# Patient Record
Sex: Male | Born: 1945 | Race: White | Hispanic: Yes | Marital: Married | Smoking: Never smoker
Health system: Southern US, Community
[De-identification: ages and names within clinical notes are randomized; demographics above are authoritative.]

## PROBLEM LIST (undated history)

## (undated) DIAGNOSIS — J302 Other seasonal allergic rhinitis: Secondary | ICD-10-CM

## (undated) DIAGNOSIS — S022XXA Fracture of nasal bones, initial encounter for closed fracture: Secondary | ICD-10-CM

## (undated) DIAGNOSIS — N400 Enlarged prostate without lower urinary tract symptoms: Secondary | ICD-10-CM

## (undated) HISTORY — DX: Other seasonal allergic rhinitis: J30.2

## (undated) HISTORY — DX: Benign prostatic hyperplasia without lower urinary tract symptoms: N40.0

---
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
## 2001-12-22 ENCOUNTER — Encounter: Payer: Self-pay | Admitting: General Surgery

## 2001-12-22 ENCOUNTER — Ambulatory Visit (HOSPITAL_COMMUNITY): Admission: RE | Admit: 2001-12-22 | Discharge: 2001-12-22 | Payer: Self-pay | Admitting: General Surgery

## 2002-05-25 ENCOUNTER — Emergency Department (HOSPITAL_COMMUNITY): Admission: EM | Admit: 2002-05-25 | Discharge: 2002-05-26 | Payer: Self-pay | Admitting: Emergency Medicine

## 2009-07-20 DIAGNOSIS — S022XXA Fracture of nasal bones, initial encounter for closed fracture: Secondary | ICD-10-CM

## 2009-07-20 HISTORY — DX: Fracture of nasal bones, initial encounter for closed fracture: S02.2XXA

## 2010-03-21 ENCOUNTER — Emergency Department (HOSPITAL_BASED_OUTPATIENT_CLINIC_OR_DEPARTMENT_OTHER): Admission: EM | Admit: 2010-03-21 | Discharge: 2010-03-21 | Payer: Self-pay | Admitting: Emergency Medicine

## 2010-03-21 ENCOUNTER — Ambulatory Visit: Payer: Self-pay | Admitting: Diagnostic Radiology

## 2010-05-08 ENCOUNTER — Ambulatory Visit (HOSPITAL_COMMUNITY): Admission: RE | Admit: 2010-05-08 | Discharge: 2010-05-08 | Payer: Self-pay | Admitting: General Surgery

## 2011-08-05 IMAGING — CR DG FACIAL BONES COMPLETE 3+V
5 series · 5 of 5 positions shown · non-contrast
Comparison: None.

CLINICAL DATA: Nasal laceration.  Fall.

FACIAL BONES COMPLETE 3+V

[w waters *]
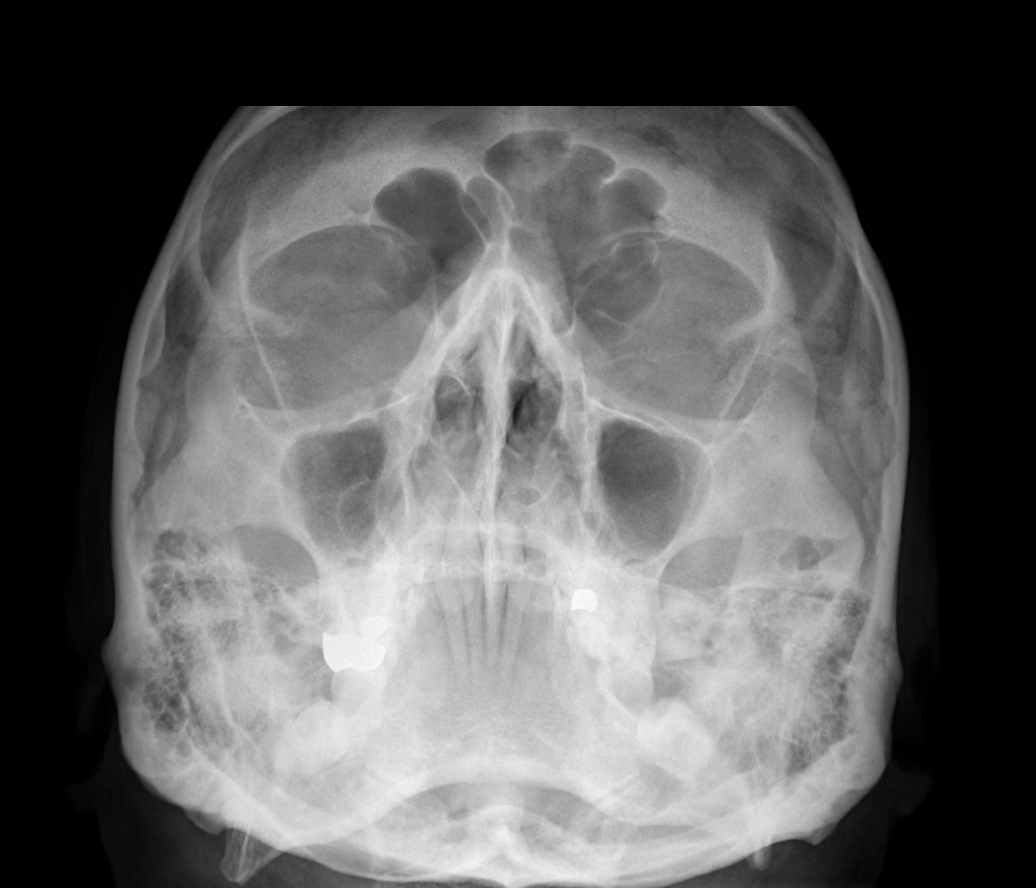

[[person_name] * (1 of 2)]
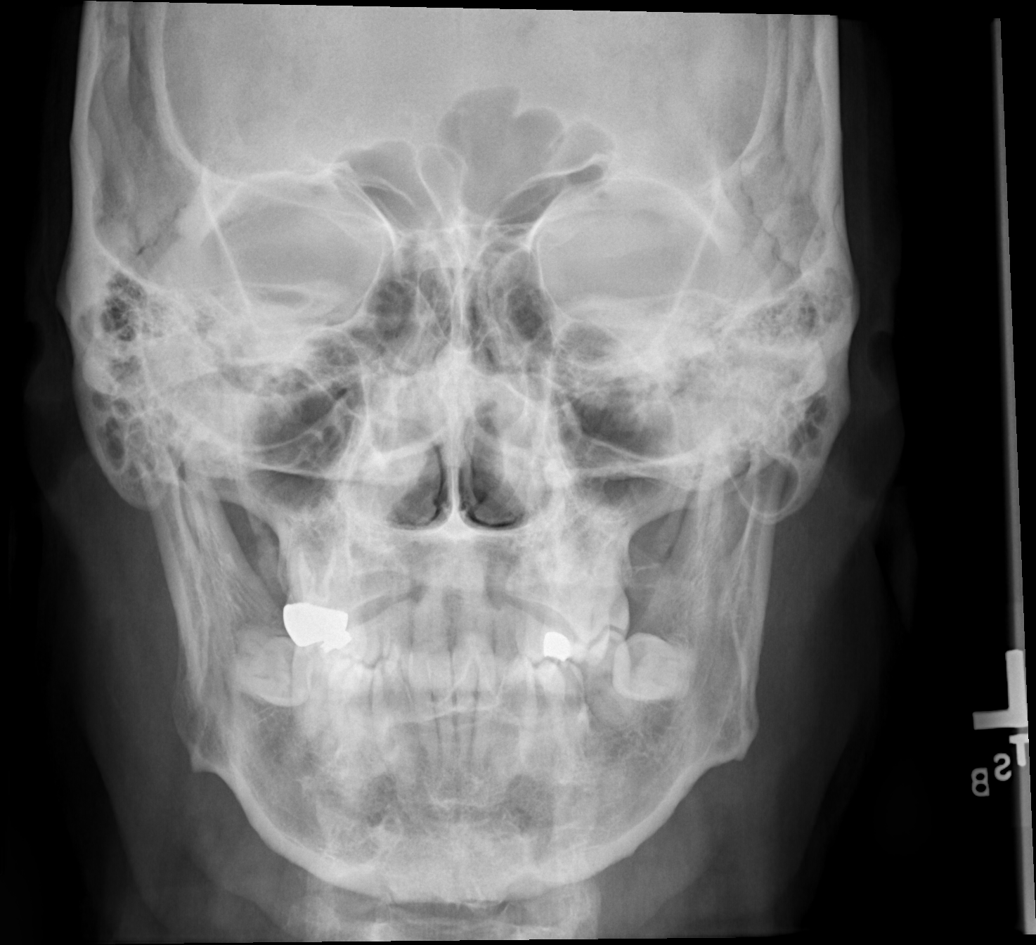

[w skull lat]
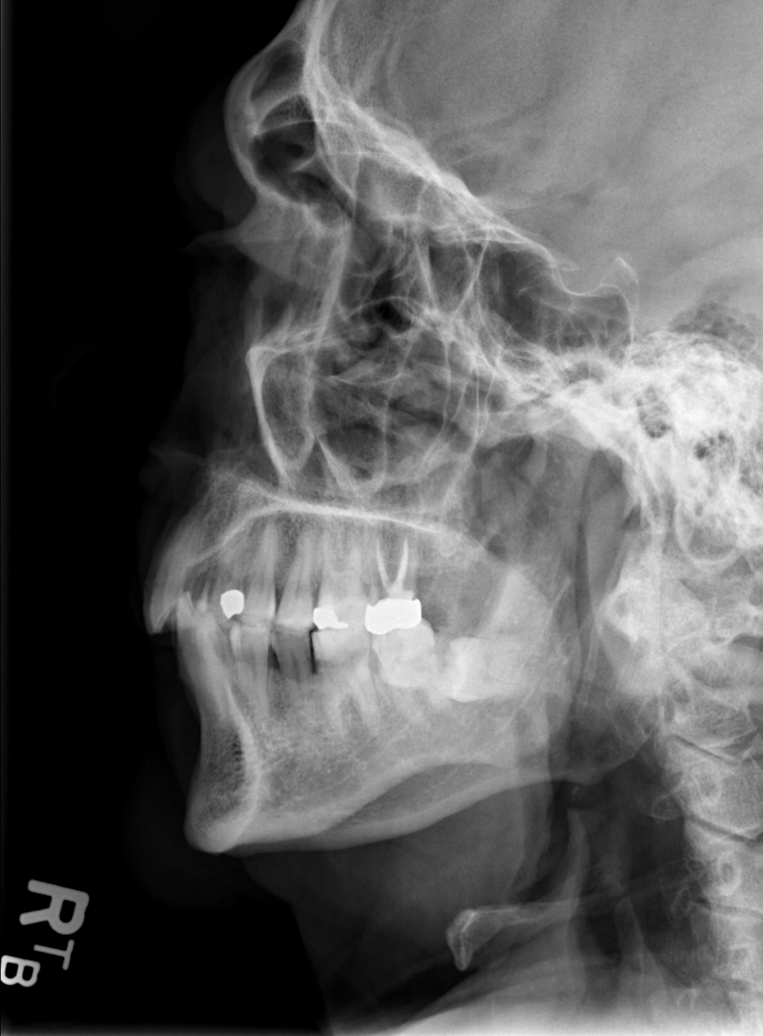

[w smv *]
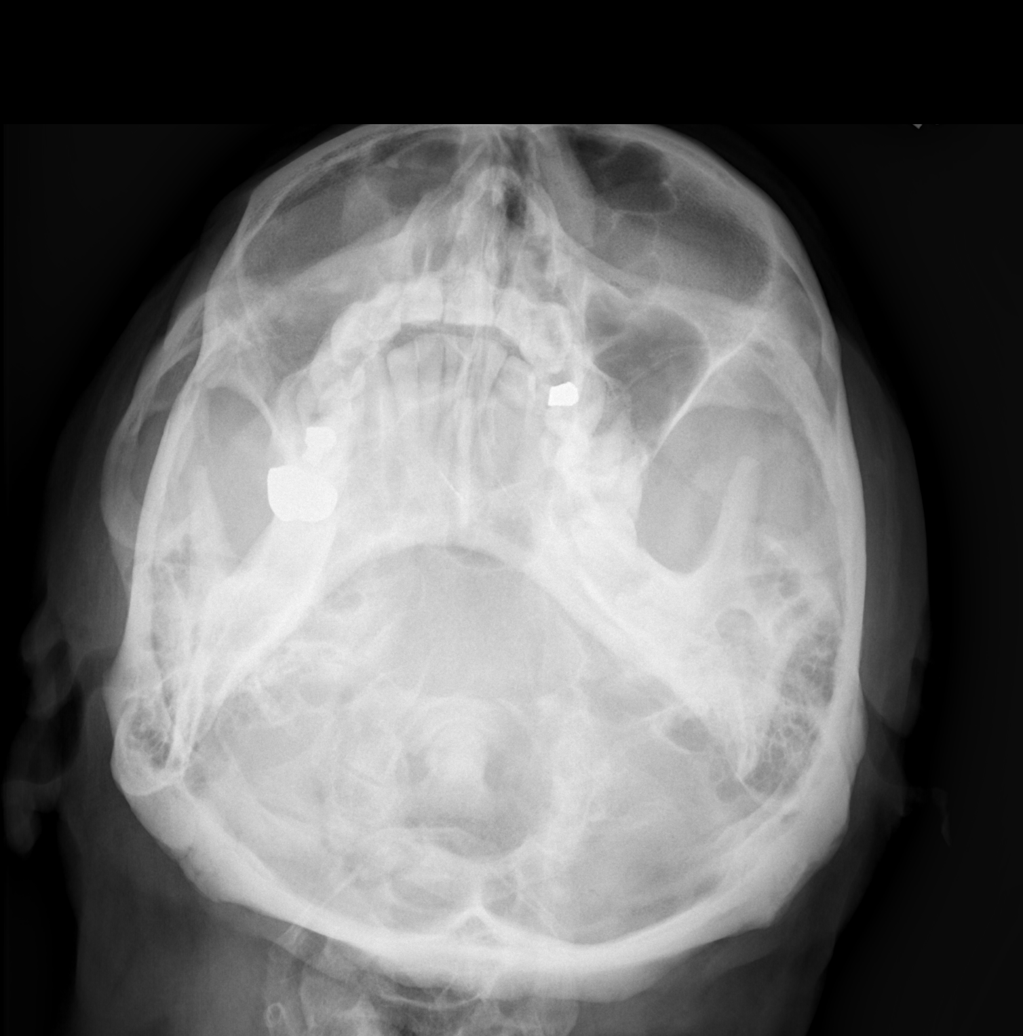

[[person_name] * (2 of 2)]
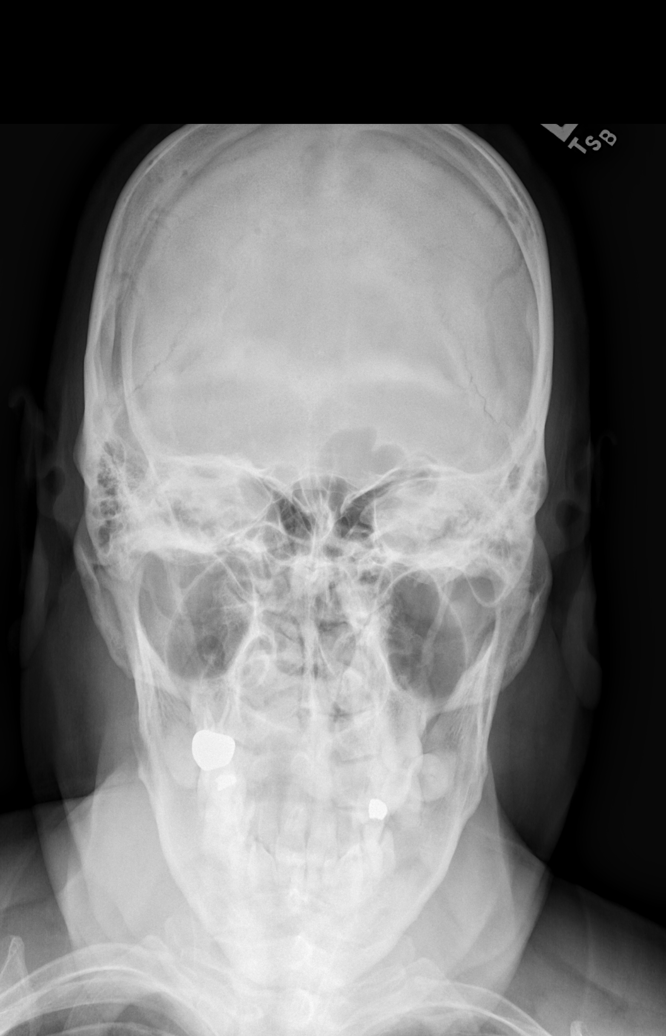

[5 of 5 positions shown; findings below may reference images not displayed]

FINDINGS: There is soft tissue swelling over the glabella on the
lateral view and a tiny nondisplaced fracture of the tip of the
nasal bones.  The paranasal sinuses appear aerated.  No mandibular
fracture is identified.
IMPRESSION: Minimally displaced fracture of the tip of the nasal bone,
presumably the right.

## 2013-02-15 DIAGNOSIS — R079 Chest pain, unspecified: Secondary | ICD-10-CM

## 2013-02-16 DIAGNOSIS — R079 Chest pain, unspecified: Secondary | ICD-10-CM

## 2014-01-01 ENCOUNTER — Ambulatory Visit (HOSPITAL_COMMUNITY)
Admission: RE | Admit: 2014-01-01 | Discharge: 2014-01-01 | Disposition: A | Discharge status: Home / Self Care | Payer: Medicare Other | Source: Ambulatory Visit | Attending: General Surgery | Admitting: General Surgery

## 2014-01-01 ENCOUNTER — Other Ambulatory Visit (HOSPITAL_COMMUNITY): Payer: Self-pay | Admitting: General Surgery

## 2014-01-01 DIAGNOSIS — M79609 Pain in unspecified limb: Secondary | ICD-10-CM | POA: Insufficient documentation

## 2014-01-01 DIAGNOSIS — M779 Enthesopathy, unspecified: Secondary | ICD-10-CM

## 2018-08-16 DIAGNOSIS — N401 Enlarged prostate with lower urinary tract symptoms: Secondary | ICD-10-CM | POA: Insufficient documentation

## 2019-02-23 ENCOUNTER — Ambulatory Visit (INDEPENDENT_AMBULATORY_CARE_PROVIDER_SITE_OTHER): Payer: Medicare Other | Admitting: Infectious Diseases

## 2019-02-23 ENCOUNTER — Encounter: Payer: Self-pay | Admitting: Infectious Diseases

## 2019-02-23 ENCOUNTER — Other Ambulatory Visit: Payer: Self-pay

## 2019-02-23 DIAGNOSIS — B429 Sporotrichosis, unspecified: Secondary | ICD-10-CM | POA: Diagnosis present

## 2019-02-23 DIAGNOSIS — R7303 Prediabetes: Secondary | ICD-10-CM | POA: Diagnosis not present

## 2019-02-23 MED ORDER — TERBINAFINE HCL 250 MG PO TABS: 250.0000 mg | ORAL_TABLET | Freq: Every day | ORAL | 1 refills | Status: DC

## 2019-02-23 NOTE — Patient Instructions (Signed)
Continue terbinafine once daily until next appointment (refill sent to your pharmacy). Return to see Dr. Prince Rome in 5 weeks.

## 2019-02-23 NOTE — Progress Notes (Signed)
Subjective:    Patient ID: Paul Burch, male    DOB: 1945/11/24, 73 y.o.   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 Rose bush thorn stick ~4 months ago when doing yardwork w/o gloves. Initially given augmentin w some improvement but wound remained open w/ some drainage, so went to ER. He was given itraconazole for 3 months with partial improvement. Within 7-10 days of completing itraconazole, pain only returned (lesion was healed by this time). Denies ever having sporotrichoid spread up arm. PCP gave terbinafine w/ some improvement recently. Now no wound present. No prior wound cx was obtained when wound was actively draining. He denies any systemic fevers and chills. Of note, pt reports transaminitis while taking itaconazole but also admits to drinking wine daily at that time as well. He denies systemic F/C or weight loss at any time after injury.    Past Medical History:  Diagnosis Date  . BPH (benign prostatic hyperplasia)   . Nasal fracture 2011  . Seasonal allergies     History reviewed. No pertinent surgical history.   History reviewed. No pertinent family history.  Parents deceased. All 5 children are healthy per pt.  Social History   Tobacco Use  . Smoking status: Never Smoker  . Smokeless tobacco: Never Used  Substance Use Topics  . Alcohol use: Not Currently  . Drug use: Not Currently      has no history on file for sexual activity.   Outpatient Medications Prior to Visit  Medication Sig Dispense Refill  . terbinafine (LAMISIL) 250 MG tablet Take by mouth.    Marland Kitchen. amoxicillin-clavulanate (AUGMENTIN) 875-125 MG tablet TAKE ONE TABLET BY MOUTH EVERY TWELVE HOURS FOR 10 DAYS.     No facility-administered medications prior to visit.      Not on File    Review of Systems  Constitutional: Negative for chills, fatigue and fever.  HENT: Negative for  congestion, hearing loss, rhinorrhea and sinus pressure.   Eyes: Negative for photophobia, pain, redness and visual disturbance.  Respiratory: Negative for apnea, cough, shortness of breath and wheezing.   Cardiovascular: Negative for chest pain and palpitations.  Gastrointestinal: Negative for abdominal pain, constipation, diarrhea, nausea and vomiting.  Endocrine: Negative for cold intolerance, heat intolerance, polydipsia and polyuria.  Genitourinary: Negative for decreased urine volume, dysuria, frequency, hematuria and testicular pain.  Musculoskeletal: Negative for back pain, myalgias and neck pain.  Skin: Negative for pallor and rash.  Allergic/Immunologic: Negative for immunocompromised state.  Neurological: Positive for numbness. Negative for dizziness, seizures, syncope, speech difficulty and light-headedness.       LT index finger  Hematological: Does not bruise/bleed easily.  Psychiatric/Behavioral: Negative for agitation and hallucinations. The patient is not nervous/anxious.        Objective:    There were no vitals filed for this visit. Physical Exam Gen: pleasant, NAD, A&Ox 3 Head: NCAT, no temporal wasting evident EENT: PERRL, EOMI, MMM, adequate dentition Neck: supple, no JVD CV: NRRR, no murmurs evident Pulm: CTA bilaterally, no wheeze or retractions Abd: soft, NTND, +BS Extrems: no LE edema, 2+ pulses Skin: no rashes, healed non-erythematous nodule along LT index finger (non-tender w/o drainage), adequate skin turgor Neuro: CN II-XII grossly intact, no focal neurologic deficits appreciated, gait was WNL, A&Ox 3   Labs: No results found for: WBC, HGB, HCT, MCV, PLT No results found for: NA, K,  CL, CO2, GLUCOSE, BUN, CREATININE, CALCIUM, MG, PHOS No results found for: CRP     Assessment & Plan:  The patient is a 73 y/o [place] male with BPH and recent LT hand injury from [place] and now 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   1. 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  The patient has no active lesions to his left hand or arm at present, so I am concerned that his complaints of left index finger pain and numbness may be more related to a neurologic injury rather than an infectious etiology.  He has already completed an appropriate 3 months of oral itraconazole with limited improvement and now has been taking terbinafine for the last month.  I have instructed the patient to continue this medication for the next 2 months on the off chance that he did have sporotrichosis.  Will check sporothrix serologies to assess for recent or past exposure to see if we can confirm true infection as he has no active wound drainage for me to send for cx today. Terbinafine renewed at today's visit given his recent reported transaminitis while on itraconazole.   2. Recent transaminitis - Offered to repeat the patient's CMP today, but he declined. The patient was reminded to abstain from all alcohol while he is taking terbinafine as well as it may also cause transaminitis, especially when mixed with wine as he admits to doing with his recent itraconazole course.  3. Borderline DM - Per PCP records, pt carries this dx. He does not check his blood sugars regularly to know if he has had recent hyperglycemia contributing to his recent LT index finger infection. This will result in delayed wound healing, possible peripheral neuropathy, and increased likelihood of developing infections if glycemic control is not improved.

## 2019-03-01 LAB — SPOROTHRIX,AB,SERUM: Sporothrix AB,serum: NEGATIVE

## 2019-04-06 ENCOUNTER — Encounter: Payer: Self-pay | Admitting: Infectious Diseases

## 2019-04-06 ENCOUNTER — Ambulatory Visit (INDEPENDENT_AMBULATORY_CARE_PROVIDER_SITE_OTHER): Payer: Medicare Other | Admitting: Infectious Diseases

## 2019-04-06 ENCOUNTER — Other Ambulatory Visit: Payer: Self-pay

## 2019-04-06 VITALS — BP 161/83 | HR 63 | Temp 98.0°F

## 2019-04-06 DIAGNOSIS — B429 Sporotrichosis, unspecified: Secondary | ICD-10-CM

## 2019-04-06 DIAGNOSIS — B029 Zoster without complications: Secondary | ICD-10-CM | POA: Diagnosis not present

## 2019-04-06 MED ORDER — VALACYCLOVIR HCL 1 G PO TABS: 1000.0000 mg | ORAL_TABLET | Freq: Three times a day (TID) | ORAL | 0 refills | Status: DC

## 2019-04-06 MED ORDER — TERBINAFINE HCL 250 MG PO TABS: 250.0000 mg | ORAL_TABLET | Freq: Every day | ORAL | 1 refills | Status: DC

## 2019-04-06 NOTE — Patient Instructions (Signed)
Take valtrex and terbinafine as prescribed.

## 2019-04-06 NOTE — Progress Notes (Signed)
Subjective:    Patient ID: Paul Burch, male    DOB: Jun 11, 1946, 73 y.o.   MRN: [id number]  HPI The patient is a 73 y/o [place] male presenting for a routine return visit for possible sporotrichosis. At his last visit, he reported a rose bush thorn stick ~6 months ago when doing yardwork w/o gloves. Initially given augmentin with some improvement but his wound remained open w/ some drainage, so he went to ER. He was given itraconazole for 3 months with partial improvement. Within 7-10 days of completing itraconazole, he reports the return of pain only (no further skin lesions though). He still denies ever having sporotrichoid spread up his arm. His PCP gave terbinafine w/ some improvement. No wound has been present since his initial wound healed. He c/o persistent numbness to his LT hand/finger but has had no recurrent wounds noted to his LT finger or arm since his initial injury. Serologies were negative for sporotrichosis. He has continued to take terbinafine since his last visit. In recent weeks, he has developed a vesiculopapular rash to his LT face and LT chest wall. He admits the lesions are painful. He denies any recent exposure to children or others with chickenpox.   Past Medical History:  Diagnosis Date  . BPH (benign prostatic hyperplasia)   . Nasal fracture 2011  . Seasonal allergies     History reviewed. No pertinent surgical history.   History reviewed. No pertinent family history. Has 5 children (pt reports all are healthy). Both of his parents are deceased.  Social History   Tobacco Use  . Smoking status: Never Smoker  . Smokeless tobacco: Never Used  Substance Use Topics  . Alcohol use: Not Currently  . Drug use: Not Currently      has no history on file for sexual activity.   Outpatient Medications Prior to Visit  Medication Sig Dispense Refill  . amoxicillin-clavulanate (AUGMENTIN) 875-125 MG tablet TAKE ONE TABLET BY MOUTH EVERY TWELVE HOURS FOR 10 DAYS.    Marland Kitchen  terbinafine (LAMISIL) 250 MG tablet Take 1 tablet (250 mg total) by mouth daily. (Patient not taking: Reported on 04/06/2019) 30 tablet 1   No facility-administered medications prior to visit.      Not on File    Review of Systems  Constitutional: Negative for chills, fatigue and fever.  HENT: Negative for congestion, hearing loss, rhinorrhea and sinus pressure.   Eyes: Negative for photophobia, pain, redness and visual disturbance.  Respiratory: Negative for apnea, cough, shortness of breath and wheezing.   Cardiovascular: Negative for chest pain and palpitations.  Gastrointestinal: Negative for abdominal pain, constipation, diarrhea, nausea and vomiting.  Endocrine: Negative for cold intolerance, heat intolerance, polydipsia and polyuria.  Genitourinary: Negative for decreased urine volume, dysuria, frequency, hematuria and testicular pain.  Musculoskeletal: Negative for back pain, myalgias and neck pain.  Skin: Positive for rash. Negative for pallor.       To LT face and chest  Allergic/Immunologic: Negative for immunocompromised state.  Neurological: Positive for numbness. Negative for dizziness, seizures, syncope, speech difficulty and light-headedness.       LT finger  Hematological: Does not bruise/bleed easily.  Psychiatric/Behavioral: Negative for agitation and hallucinations. The patient is not nervous/anxious.        Objective:    Vitals:   04/06/19 1037  BP: (!) 161/83  Pulse: 63  Temp: 98 F (36.7 C)   Physical Exam Gen: pleasant, NAD, A&Ox 3 Head: NCAT, no temporal wasting evident EENT: PERRL, EOMI, MMM,  adequate dentition Neck: supple, no JVD CV: NRRR, no murmurs evident Pulm: CTA bilaterally, no wheeze or retractions Abd: soft, NTND, +BS Extrems: no LE edema, 2+ pulses Skin: no lesions noted along LT hand or arm, adequate skin turgor, clusters of vesiculopapular lesions clustered around LT ear and along LT chest wall Neuro: CN II-XII grossly intact, no  focal neurologic deficits appreciated, gait was not assessed, A&Ox 3   Labs: No results found for: WBC, HGB, HCT, MCV, PLT No results found for: NA, K, CL, CO2, GLUCOSE, BUN, CREATININE, CALCIUM, MG, PHOS No results found for: CRP  Sporothrix Ab - negative     Assessment & Plan:  The patient is a 73 y/o [place] male with BPH and recent LT hand injury now with facial and chest rash.   1. 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  Furthermore, his lack of support are quite spread along his ipsilateral arm would also argue against this diagnosis.  The patient has no active lesions to his left hand or arm at present, so I am concerned that his complaints of left index finger pain and numbness may be more related to a neurologic injury rather than an infectious etiology.  He has appropriately completed 3 months of oral itraconazole with limited improvement and now has been taking terbinafine for the last 2+ months.  I have instructed the patient to continue this medication for the next 6 weeks on the off chance that he did have sporotrichosis.  After which time, if the patient has persistent pain alternative diagnoses may need to be considered, the majority of which would be noninfectious.  2. Shingles -clinically, the patient has multiple lesions to both his left face and left chest wall that appear vesicular and most consistent with shingles.  Due to language and communication issues, it is unclear if the patient has recently received steroids that would allow for this more recent eruption to have emerged.  An alternative but less likely possibility would be herpetic whitlow from the patient's hand with self inoculation to his left cheek and chest wall.  For both of these considerations, I have given the patient a course of Valtrex 1 g every 8 hours to take for 1 week to assess for improvement.   Should this result in resolution of his lesions, it may be reasonable to consider suppressive Valtrex at 1 g p.o. daily for 3 to 6 months thereafter.  At the patient's preference, will allow him to follow with his primary care physician for such maintenance therapy if needed.

## 2019-04-10 ENCOUNTER — Encounter: Payer: Self-pay | Admitting: Infectious Diseases

## 2019-08-23 ENCOUNTER — Other Ambulatory Visit: Payer: Self-pay | Admitting: Orthopedic Surgery

## 2019-08-24 ENCOUNTER — Other Ambulatory Visit: Payer: Self-pay | Admitting: Orthopedic Surgery

## 2019-08-24 DIAGNOSIS — B4289 Other forms of sporotrichosis: Secondary | ICD-10-CM

## 2019-08-24 DIAGNOSIS — S60451A Superficial foreign body of left index finger, initial encounter: Secondary | ICD-10-CM

## 2019-08-24 DIAGNOSIS — B429 Sporotrichosis, unspecified: Secondary | ICD-10-CM

## 2019-08-28 ENCOUNTER — Other Ambulatory Visit: Payer: Self-pay

## 2019-08-28 ENCOUNTER — Encounter: Payer: Self-pay | Admitting: Internal Medicine

## 2019-08-28 ENCOUNTER — Ambulatory Visit (INDEPENDENT_AMBULATORY_CARE_PROVIDER_SITE_OTHER): Payer: Medicare Other | Admitting: Internal Medicine

## 2019-08-28 DIAGNOSIS — L089 Local infection of the skin and subcutaneous tissue, unspecified: Secondary | ICD-10-CM | POA: Diagnosis present

## 2019-08-28 NOTE — Progress Notes (Signed)
[facility]       Patient ID: Paul Burch, male    DOB: 02/11/1946, 74 y.o.   MRN: [id number]  HPI:   He is here for a follow up visit.   He was previously seen by my partner Dr. Lorenso Courier for a finger infection of his left index finger. He initially had been stuck by a thorn from a rose bush while working outside and developed an open lesion and the thorn stuck in the finger.  He pulled it out then went to the ED.  He was given Augmentin and improved some but still had continued pain in the finger.  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  He though had some transaminitits after restartting it with concern for recurrent infection.  He saw my previous partner, Dr. Lorenso Courier in August who examined his finger and did not find any infection but had him continue with terbinafine, which he was prescribed as an alternative.  He completed another 3 months then felt his finger was hurting again and restarted left over itraconazole which he has now stopped.  He describes the pain as like granules of sand underneath and also his finger falls asleep. No erythema, no warmth, no swelling.  He had an xray and brings a copy of it today. He has an ultrasound scheduled tomorrow by Dr. Merlyn Lot to check for foreign body.  Interview done in [place].   Past Medical History:  Diagnosis Date  . BPH (benign prostatic hyperplasia)   . Nasal fracture 2011  . Seasonal allergies     Prior to Admission medications   Medication Sig Start Date End Date Taking? Authorizing Provider  amoxicillin-clavulanate (AUGMENTIN) 875-125 MG tablet TAKE ONE TABLET BY MOUTH EVERY TWELVE HOURS FOR 10 DAYS. 10/21/18   [provider]  terbinafine (LAMISIL) 250 MG tablet Take 1 tablet (250 mg total) by mouth daily. Patient not taking: Reported on 08/28/2019 04/06/19   Powers, Arley Phenix, MD  valACYclovir (VALTREX) 1000 MG tablet Take 1 tablet (1,000 mg total)  by mouth 3 (three) times daily. Patient not taking: Reported on 08/28/2019 04/06/19   Powers, Arley Phenix, MD    Not on File  Social History   Tobacco Use  . Smoking status: Never Smoker  . Smokeless tobacco: Never Used  Substance Use Topics  . Alcohol use: Not Currently  . Drug use: Not Currently   [facility]: + cardiac disease  Review of Systems  Constitutional: negative for fevers, chills and malaise Gastrointestinal: negative for nausea and diarrhea Musculoskeletal: negative for myalgias and arthralgias All other systems reviewed and are negative    Constitutional: in no apparent distress There were no vitals filed for this visit. EYES: anicteric Musculoskeletal: his left index finger is without warmth, no edema, no tenderness, no open lesion, no joint effusion; full ROM of finger Skin: negatives: no rash Neuro: non-focal  Labs: No results found for: WBC, HGB, HCT, MCV, PLT No results found for: CREATININE, BUN, NA, K, CL, CO2 No results found for: ALT, AST, GGT, ALKPHOS, BILITOT, INR   Assessment: finger infection, resolved.  No current signs or symptoms of infection and xray reviewed, no foreign material noted.  No active infection and no indication for any treatment.   Follow up PRN.  Patient advised to call if he has new concerns including new swelling, erythema, open lesion.    Plan: 1) follow up PRN  30  minutes spent on the interview and exam with discussion of above including 15 minutes face to face

## 2019-08-29 ENCOUNTER — Ambulatory Visit
Admission: RE | Admit: 2019-08-29 | Discharge: 2019-08-29 | Disposition: A | Discharge status: Home / Self Care | Payer: Medicare Other | Source: Ambulatory Visit | Attending: Orthopedic Surgery | Admitting: Orthopedic Surgery

## 2019-08-29 DIAGNOSIS — B4289 Other forms of sporotrichosis: Secondary | ICD-10-CM

## 2019-08-29 DIAGNOSIS — B429 Sporotrichosis, unspecified: Secondary | ICD-10-CM

## 2019-08-29 DIAGNOSIS — S60451A Superficial foreign body of left index finger, initial encounter: Secondary | ICD-10-CM

## 2020-03-04 NOTE — Congregational Nurse Program (Signed)
Stated he is doing okay. Had a recent fall at his trailer and was seen in the ER. Feels much better and had no injuries. BP 153/82 P65 discussed being seen at the [facility] but stated he will wait. Explained importance of keeping close check on his blood pressure. Reviewed diet,exercise, fluid intake and getting proper rest. Voiced understanding Jenene Slicker RN, [place], [phone]

## 2020-06-17 NOTE — Congregational Nurse Program (Signed)
  Dept: [phone]   Congregational Nurse Program Note  Date of Encounter: 06/17/2020  Past Medical History: Past Medical History:  Diagnosis Date  . BPH (benign prostatic hyperplasia)   . Nasal fracture 2011  . Seasonal allergies     Encounter Details:  CNP Questionnaire - 06/17/20 1221      Questionnaire   Do you give verbal consent to treat you today? 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  No complaints or concerns . BP 164/84- P 72  Discussed concerns over his BP being up again since his  Last reading and reviewed proper diet and exercise. Voiced understanding and agreedt to be seen at the [facility]. Told will make appointment and give him a call when scheduled Jenene Slicker RN, [place], [phone]

## 2020-06-21 NOTE — Congregational Nurse Program (Signed)
  Dept: [phone]   Congregational Nurse Program Note  Date of Encounter: 06/21/2020  Past Medical History: Past Medical History:  Diagnosis Date  . BPH (benign prostatic hyperplasia)   . Nasal fracture 2011  . Seasonal allergies     Encounter Details:  CNP Questionnaire - 06/21/20 2054      Questionnaire   Do you give verbal consent to treat you today? 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 BP 145/79 - P-66. Asked about seeing his MD and he said at this time he does't have a PCP. Talked about the [facility] and agreed for me to make an appointment. Telephone call to clinic appointment made for January 4th at 10am Reviewed importance of eating a heart healthy diet and getting exercise. Voice understanding Jenene Slicker RN, [place], [phone]

## 2020-09-15 NOTE — Congregational Nurse Program (Signed)
  Dept: [phone]   Congregational Nurse Program Note  Date of Encounter: 09/15/2020  Past Medical History: Past Medical History:  Diagnosis Date  . BPH (benign prostatic hyperplasia)   . Nasal fracture 2011  . Seasonal allergies     Encounter Details:  CNP Questionnaire - 09/13/20 1235      Questionnaire   Do you give verbal consent to treat you today? 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 BP 146/80, P 56, Blood Sugar Random 125. Jenene Slicker RN, [place], [phone]

## 2021-01-25 NOTE — Congregational Nurse Program (Signed)
  Dept: [phone]   Congregational Nurse Program Note  Date of Encounter: 01/10/2021  Past Medical History: Past Medical History:  Diagnosis Date   BPH (benign prostatic hyperplasia)    Nasal fracture 2011   Seasonal allergies     Encounter Details:  CNP Questionnaire - 01/10/21 1225       Questionnaire   Do you give verbal consent to treat you today? 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 Stated he likes to keep up with his blood pressure.Reminded of importance diet and exercise was to maintain a healthy heart. BP 121/74; Pulse [address] RN, [place], [phone]

## 2021-03-19 ENCOUNTER — Other Ambulatory Visit (HOSPITAL_BASED_OUTPATIENT_CLINIC_OR_DEPARTMENT_OTHER): Payer: Self-pay

## 2021-03-19 DIAGNOSIS — E669 Obesity, unspecified: Secondary | ICD-10-CM

## 2021-03-19 DIAGNOSIS — R5383 Other fatigue: Secondary | ICD-10-CM

## 2021-03-19 DIAGNOSIS — R0683 Snoring: Secondary | ICD-10-CM

## 2021-04-02 ENCOUNTER — Ambulatory Visit: Payer: Medicare Other | Attending: Neurology | Admitting: Neurology

## 2021-04-02 ENCOUNTER — Other Ambulatory Visit: Payer: Self-pay

## 2021-04-02 DIAGNOSIS — R0683 Snoring: Secondary | ICD-10-CM | POA: Insufficient documentation

## 2021-04-02 DIAGNOSIS — R32 Unspecified urinary incontinence: Secondary | ICD-10-CM | POA: Diagnosis present

## 2021-04-02 DIAGNOSIS — E669 Obesity, unspecified: Secondary | ICD-10-CM

## 2021-04-02 DIAGNOSIS — R400 Somnolence: Secondary | ICD-10-CM | POA: Insufficient documentation

## 2021-04-02 DIAGNOSIS — R5383 Other fatigue: Secondary | ICD-10-CM

## 2021-04-14 NOTE — Procedures (Signed)
    [facility] Aydenn Gervin A. Gerilyn Pilgrim, MD     [email or web address]             NOCTURNAL POLYSOMNOGRAPHY   LOCATION: [facility]   Patient Name: Paul Burch, Paul Burch Date: 04/02/2021 Gender: Male D.O.B: 25-Jul-1945 Age (years): 65 Referring Provider: Felicie Morn PA Height (inches): 65 Interpreting Physician: Beryle Beams MD, ABSM Weight (lbs): 184 RPSGT: Peak, Robert BMI: 31 MRN: [id number] Neck Size: 16.00 CLINICAL INFORMATION Sleep Study Type: NPSG     Indication for sleep study: Fatigue, Obesity, Snoring     Epworth Sleepiness Score: N/A     SLEEP STUDY TECHNIQUE As per the AASM Manual for the Scoring of Sleep and Associated Events v2.3 (April 2016) with a hypopnea requiring 4% desaturations.  The channels recorded and monitored were frontal, central and occipital EEG, electrooculogram (EOG), submentalis EMG (chin), nasal and oral airflow, thoracic and abdominal wall motion, anterior tibialis EMG, snore microphone, electrocardiogram, and pulse oximetry.  MEDICATIONS Medications self-administered by patient taken the night of the study : N/A No current outpatient medications on file.     SLEEP ARCHITECTURE The study was initiated at 9:30:26 PM and ended at 5:18:46 AM.  Sleep onset time was 47.1 minutes and the sleep efficiency was 60.2%. The total sleep time was 281.8 minutes.  Stage REM latency was 267.5 minutes.  The patient spent 8.16% of the night in stage N1 sleep, 79.59% in stage N2 sleep, 0.00% in stage N3 and 12.2% in REM.  Alpha intrusion was absent.  Supine sleep was 0.00%.  RESPIRATORY PARAMETERS The overall apnea/hypopnea index (AHI) was 0.2 per hour. There were 0 total apneas, including 0 obstructive, 0 central and 0 mixed apneas. There were 1 hypopneas and 2 RERAs.  The AHI during Stage REM sleep was 1.7 per hour.  AHI while supine was N/A per hour.  The mean oxygen saturation was 93.78%. The minimum SpO2 during sleep was  91.00%.  soft snoring was noted during this study.  CARDIAC DATA The 2 lead EKG demonstrated sinus rhythm. The mean heart rate was 44.45 beats per minute. Other EKG findings include: None.  LEG MOVEMENT DATA The total PLMS were 0 with a resulting PLMS index of 0.00. Associated arousal with leg movement index was 0.0.  IMPRESSIONS Absent slow-wave sleep is noted. Otherwise, the study is unrevealing. No obstructive sleep apnea syndrome is noted.   Argie Ramming, MD Diplomate, American Board of Sleep Medicine.  ELECTRONICALLY SIGNED ON:  04/14/2021, 3:57 PM [facility] PH: [phone]   FX: [phone] ACCREDITED BY THE AMERICAN ACADEMY OF SLEEP MEDICINE

## 2021-04-28 NOTE — Congregational Nurse Program (Signed)
  Dept: [phone]   Congregational Nurse Program Note  Date of Encounter: 09/13/2020  Past Medical History: Past Medical History:  Diagnosis Date   BPH (benign prostatic hyperplasia)    Nasal fracture 2011   Seasonal allergies     Encounter DetailsI Stated he was doing fine. Asked if he needed to be seen for a wellness exam at the free clinic and he agreed. Made call to [facility] and appointment scheduled but client changed his mind. Told me he will wait unt Another day..told to let me know. BP 140/72 HR [address], [place], [phone]

## 2021-06-16 ENCOUNTER — Encounter: Payer: Self-pay | Admitting: *Deleted

## 2021-07-24 ENCOUNTER — Ambulatory Visit (INDEPENDENT_AMBULATORY_CARE_PROVIDER_SITE_OTHER): Payer: Medicare (Managed Care) | Admitting: Urology

## 2021-07-24 ENCOUNTER — Encounter: Payer: Self-pay | Admitting: Urology

## 2021-07-24 ENCOUNTER — Other Ambulatory Visit: Payer: Self-pay

## 2021-07-24 VITALS — BP 160/83 | HR 59 | Wt 180.0 lb

## 2021-07-24 DIAGNOSIS — N3941 Urge incontinence: Secondary | ICD-10-CM | POA: Diagnosis not present

## 2021-07-24 DIAGNOSIS — R351 Nocturia: Secondary | ICD-10-CM | POA: Diagnosis not present

## 2021-07-24 DIAGNOSIS — R350 Frequency of micturition: Secondary | ICD-10-CM | POA: Diagnosis not present

## 2021-07-24 LAB — URINALYSIS, ROUTINE W REFLEX MICROSCOPIC
Bilirubin, UA: NEGATIVE
Glucose, UA: NEGATIVE
Ketones, UA: NEGATIVE
Leukocytes,UA: NEGATIVE
Nitrite, UA: NEGATIVE
Protein,UA: NEGATIVE
RBC, UA: NEGATIVE
Specific Gravity, UA: <1.005 — ABNORMAL LOW (ref 1.005–1.030)
Urobilinogen, Ur: 0.2 mg/dL (ref 0.2–1.0)
pH, UA: 5.5 (ref 5.0–7.5)

## 2021-07-24 LAB — BLADDER SCAN AMB NON-IMAGING: Scan Result: 27

## 2021-07-24 MED ORDER — MIRABEGRON ER 50 MG PO TB24: 50.0000 mg | ORAL_TABLET | Freq: Every day | ORAL | 0 refills | Status: DC

## 2021-07-24 NOTE — Progress Notes (Signed)
Urological Symptom Review  Patient is experiencing the following symptoms: Frequent urination Hard to postpone urination Burning/pain with urination Get up at night to urinate Leakage of urine Weak stream   Review of Systems  Gastrointestinal (upper)  : Negative for upper GI symptoms  Gastrointestinal (lower) : Negative for lower GI symptoms  Constitutional : Fatigue  Skin: Negative for skin symptoms  Eyes: Negative for eye symptoms  Ear/Nose/Throat : Negative for Ear/Nose/Throat symptoms  Hematologic/Lymphatic: Negative for Hematologic/Lymphatic symptoms  Cardiovascular : Negative for cardiovascular symptoms  Respiratory : Negative for respiratory symptoms  Endocrine: negative  Musculoskeletal: Negative for musculoskeletal symptoms  Neurological: Negative for neurological symptoms  Psychologic: Negative for psychiatric symptoms

## 2021-07-24 NOTE — Progress Notes (Signed)
Assessment: 1. Urinary frequency   2. Nocturia   3. Urge incontinence     Plan: Diagnosis and management of overactive bladder with urge incontinence discussed with the patient.  Options for management including medical therapy, neuromodulation, and chemodenervation discussed. Trial of Myrbetriq 25 mg daily x 2 weeks then increase to 50 mg daily.  Samples given. Return to office in 1 month  Chief Complaint:  Chief Complaint  Patient presents with   Urinary Frequency    History of Present Illness:  Paul Burch is a 76 y.o. year old male who is seen in consultation from Rubin Payor, FNP for evaluation of nocturia and urinary frequency ongoing for 10 years. The pt was dx with Parkinson's approximately 4 months ago and states current sxs of frequency, nocturia, urgency and urge incontinence have improved approx. 50% since treatment began. Currently, the pt voids 3-4 times/night and continues to have urge incontinence and occasional burning prior to stream. No dysuria, hematuria. Pt denies h/o UTIs, kidney stones. Sleep study in September negative for OSA. Medicat records from [facility], NP reviewed. No PSA noted, but notes levels "normal" in the past. IPSS=30 PVR= 62ml   Past Medical History:  Past Medical History:  Diagnosis Date   BPH (benign prostatic hyperplasia)    Nasal fracture 2011   Seasonal allergies     Past Surgical History:  History reviewed. No pertinent surgical history.  Allergies:  Allergies  Allergen Reactions   Itraconazole Rash    Family History:  History reviewed. No pertinent family history.  Social History:  Social History   Tobacco Use   Smoking status: Never   Smokeless tobacco: Never  Substance Use Topics   Alcohol use: Not Currently   Drug use: Not Currently    Review of symptoms:  Constitutional:  Negative for unexplained weight loss, night sweats, fever, chills. +fatigue ENT:  Negative for nose bleeds, sinus pain, painful  swallowing CV:  Negative for chest pain, shortness of breath, exercise intolerance, palpitations, loss of consciousness Resp:  Negative for cough, wheezing, shortness of breath GI:  Negative for nausea, vomiting, diarrhea, bloody stools GU:  Positives noted in HPI; otherwise negative for gross hematuria, dysuria, urinary incontinence Neuro:  Negative for seizures, poor balance, slurred speech Psych:  Negative for depression Endocrine:  Negative for polydipsia,symptoms of hypoglycemia (dizziness, hunger, sweating) Hematologic:  Negative for anemia, purpura, petechia, prolonged or excessive bleeding, use of anticoagulants  Allergic:  Negative for difficulty breathing or choking as a result of exposure to anything; no shellfish allergy; no allergic response (rash/itch) to materials, foods  Physical exam: BP (!) 160/83 (BP Location: Left Arm)    Pulse (!) 59    Wt 180 lb (81.6 kg)  GENERAL APPEARANCE:  Well appearing, well developed, well nourished, NAD HEENT: Atraumatic, Normocephalic, oropharynx clear. NECK: Supple. Trachea midline LUNGS: Clear to auscultation bilaterally. HEART: Regular Rate and Rhythm without murmurs, gallops, or rubs. ABDOMEN: Soft, non-tender, No Masses. EXTREMITIES: Moves all extremities well.  Without clubbing, cyanosis, or edema. NEUROLOGIC:  Alert and oriented x 3, normal gait, CN II-XII grossly intact.  MENTAL STATUS:  Appropriate. BACK:  Non-tender to palpation.  No CVAT SKIN:  Warm, dry and intact.   GU: Penis:  circumcised Meatus: Normal Scrotum: normal, no masses Testis: normal without masses bilateral Epididymis: normal Prostate: 40 g, NT, no nodules Rectum: Normal tone,  no masses or tenderness   Results: U/A dipstick negative  Results for orders placed or performed in visit on 07/24/21 (from  the past 24 hour(s))  BLADDER SCAN AMB NON-IMAGING   Collection Time: 07/24/21  2:28 PM  Result Value Ref Range   Scan Result 27

## 2021-08-21 ENCOUNTER — Encounter: Payer: Self-pay | Admitting: Urology

## 2021-08-21 ENCOUNTER — Ambulatory Visit (INDEPENDENT_AMBULATORY_CARE_PROVIDER_SITE_OTHER): Payer: Medicare (Managed Care) | Admitting: Urology

## 2021-08-21 ENCOUNTER — Other Ambulatory Visit: Payer: Self-pay

## 2021-08-21 VITALS — BP 157/81 | HR 61 | Wt 185.0 lb

## 2021-08-21 DIAGNOSIS — N3941 Urge incontinence: Secondary | ICD-10-CM

## 2021-08-21 DIAGNOSIS — R350 Frequency of micturition: Secondary | ICD-10-CM | POA: Diagnosis not present

## 2021-08-21 DIAGNOSIS — R351 Nocturia: Secondary | ICD-10-CM | POA: Diagnosis not present

## 2021-08-21 LAB — URINALYSIS, ROUTINE W REFLEX MICROSCOPIC
Bilirubin, UA: NEGATIVE
Glucose, UA: NEGATIVE
Ketones, UA: NEGATIVE
Leukocytes,UA: NEGATIVE
Nitrite, UA: NEGATIVE
Protein,UA: NEGATIVE
RBC, UA: NEGATIVE
Specific Gravity, UA: 1.01 (ref 1.005–1.030)
Urobilinogen, Ur: 1 mg/dL (ref 0.2–1.0)
pH, UA: 6 (ref 5.0–7.5)

## 2021-08-21 LAB — BLADDER SCAN AMB NON-IMAGING: Scan Result: 114

## 2021-08-21 MED ORDER — GEMTESA 75 MG PO TABS: 75.0000 mg | ORAL_TABLET | Freq: Every day | ORAL | 0 refills | Status: DC

## 2021-08-21 NOTE — Progress Notes (Signed)
Urological Symptom Review  Patient is experiencing the following symptoms: Frequent urination Leakage of urine Painful intercourse   Review of Systems  Gastrointestinal (upper)  : Negative for upper GI symptoms  Gastrointestinal (lower) : Negative for lower GI symptoms  Constitutional : Negative for symptoms  Skin: Negative for skin symptoms  Eyes: Negative for eye symptoms  Ear/Nose/Throat : Negative for Ear/Nose/Throat symptoms  Hematologic/Lymphatic: Negative for Hematologic/Lymphatic symptoms  Cardiovascular : Negative for cardiovascular symptoms  Respiratory : Negative for respiratory symptoms  Endocrine: Negative for endocrine symptoms  Musculoskeletal: Back pain  Neurological: Negative for neurological symptoms  Psychologic: Negative for psychiatric symptoms

## 2021-08-21 NOTE — Progress Notes (Signed)
post void residual=114 ml

## 2021-08-21 NOTE — Progress Notes (Signed)
° °  Assessment: 1. Urinary frequency   2. Urge incontinence   3. Nocturia     Plan: D/C Myrbetriq Trial of Gemtesa 75 mg daily .  Samples given. Return to office in 1 month with bladder scan  Chief Complaint:  Chief Complaint  Patient presents with   Urinary Frequency    History of Present Illness:  Paul Burch is a 76 y.o. year old male who is seen for further evaluation of urinary frequency, nocturia, and urge incontinence.  His symptoms have been ongoing for 10 years. The pt was dx with Parkinson's approximately 4 months ago and stated current sxs of frequency, nocturia, urgency and urge incontinence have improved approx. 50% since treatment began. He was voiding 3-4 times/night and continued to have urge incontinence.  No history of dysuria, hematuria. Pt denies h/o UTIs, kidney stones. Sleep study in September negative for OSA.  IPSS=30 PVR= 27 ml  He was given a trial of Myrbetriq 25 mg daily x2 weeks increasing to 50 mg daily. He returns today for follow-up. He continues to have daytime frequency, voiding every hour.  He continues with nocturia 1-2 times per night.  He does have urgency and occasional urge incontinence.  No dysuria or gross hematuria.  He did not see any improvement in his symptoms with the Myrbetriq.  No side effects. IPSS = 14 today.  Portions of the above documentation were copied from a prior visit for review purposes only.  Past Medical History:  Past Medical History:  Diagnosis Date   BPH (benign prostatic hyperplasia)    Nasal fracture 2011   Seasonal allergies     Past Surgical History:  No past surgical history on file.  Allergies:  Allergies  Allergen Reactions   Itraconazole Rash    Family History:  No family history on file.  Social History:  Social History   Tobacco Use   Smoking status: Never   Smokeless tobacco: Never  Substance Use Topics   Alcohol use: Not Currently   Drug use: Not Currently     ROS: Constitutional:  Negative for fever, chills, weight loss CV: Negative for chest pain, previous MI, hypertension Respiratory:  Negative for shortness of breath, wheezing, sleep apnea, frequent cough GI:  Negative for nausea, vomiting, bloody stool, GERD  Physical exam: BP (!) 157/81    Pulse 61    Wt 185 lb (83.9 kg)  GENERAL APPEARANCE:  Well appearing, well developed, well nourished, NAD HEENT:  Atraumatic, normocephalic, oropharynx clear NECK:  Supple without lymphadenopathy or thyromegaly ABDOMEN:  Soft, non-tender, no masses EXTREMITIES:  Moves all extremities well, without clubbing, cyanosis, or edema NEUROLOGIC:  Alert and oriented x 3, normal gait, CN II-XII grossly intact MENTAL STATUS:  appropriate BACK:  Non-tender to palpation, No CVAT SKIN:  Warm, dry, and intact   Results: U/A: dipstick negative  PVR:  114 ml

## 2021-08-28 NOTE — Congregational Nurse Program (Signed)
°  Dept: [phone]   Congregational Nurse Program Note  Date of Encounter: 08/28/2021  Past Medical History: Past Medical History:  Diagnosis Date   BPH (benign prostatic hyperplasia)    Nasal fracture 2011   Seasonal allergies     Encounter Details:  CNP Questionnaire - 08/20/21 1220       Questionnaire   Do you give verbal consent to treat you today? Yes    Location Patient Film/video editor, [employer] or Organization    Patient Status Unknown    Insurance Medicare;Medicaid    Insurance Referral N/A    Medication N/A    Medical Provider Yes    Screening Referrals N/A    Medical Referral N/A    Medical Appointment Made N/A    Food N/A    Transportation N/A    Housing/Utilities N/A    Interpersonal Safety N/A    Intervention Blood pressure    ED Visit Averted N/A    Life-Saving Intervention Made N/A           Stated he is doing well. No problems or concerns. BP 167/78 P 55. Erma Heritage RN

## 2021-09-09 NOTE — Congregational Nurse Program (Signed)
°  Dept: [phone]   Congregational Nurse Program Note  Date of Encounter: 09/09/2021  Past Medical History: Past Medical History:  Diagnosis Date   BPH (benign prostatic hyperplasia)    Nasal fracture 2011   Seasonal allergies     Encounter Details:  CNP Questionnaire - 09/09/21 1225       Questionnaire   Do you give verbal consent to treat you today? Yes    Location Patient Film/video editor, [employer] or Organization    Patient Status Unknown    Insurance Medicare;Medicaid    Insurance Referral N/A    Medication N/A    Medical Provider Yes    Screening Referrals N/A    Medical Referral N/A    Medical Appointment Made N/A    Food N/A    Transportation N/A    Housing/Utilities N/A    Interpersonal Safety N/A    Intervention Blood pressure    ED Visit Averted N/A    Life-Saving Intervention Made N/A           Stated he was doing well and has been taking his prescribed medications as ordered. BP 121/73 P 61. Erma Heritage RN

## 2021-09-18 ENCOUNTER — Ambulatory Visit (INDEPENDENT_AMBULATORY_CARE_PROVIDER_SITE_OTHER): Payer: Medicare (Managed Care) | Admitting: Urology

## 2021-09-18 ENCOUNTER — Encounter: Payer: Self-pay | Admitting: Urology

## 2021-09-18 VITALS — BP 177/82 | HR 60

## 2021-09-18 DIAGNOSIS — R350 Frequency of micturition: Secondary | ICD-10-CM | POA: Diagnosis not present

## 2021-09-18 DIAGNOSIS — R351 Nocturia: Secondary | ICD-10-CM | POA: Diagnosis not present

## 2021-09-18 DIAGNOSIS — N3941 Urge incontinence: Secondary | ICD-10-CM

## 2021-09-18 LAB — URINALYSIS, ROUTINE W REFLEX MICROSCOPIC
Bilirubin, UA: NEGATIVE
Glucose, UA: NEGATIVE
Ketones, UA: NEGATIVE
Leukocytes,UA: NEGATIVE
Nitrite, UA: NEGATIVE
Protein,UA: NEGATIVE
RBC, UA: NEGATIVE
Specific Gravity, UA: 1.01 (ref 1.005–1.030)
Urobilinogen, Ur: 0.2 mg/dL (ref 0.2–1.0)
pH, UA: 6 (ref 5.0–7.5)

## 2021-09-18 LAB — BLADDER SCAN AMB NON-IMAGING: Scan Result: 250

## 2021-09-18 NOTE — Progress Notes (Signed)
Pt here today for bladder scan. Bladder was scanned and 250 was visualized.  ?Patient to see MD ? ?Performed by Estill Bamberg RN ? ? ?

## 2021-09-18 NOTE — Progress Notes (Signed)
Frequent   ? ?Assessment: ?1. Urinary frequency   ?2. Urge incontinence   ?3. Nocturia   ? ? ?Plan: ?Given his ongoing urinary symptoms despite medical therapy, recommend further evaluation with cystoscopy.  He may also need further evaluation with urodynamics. ?Return to office in approximately 2 weeks for cystoscopy. ? ?Chief Complaint:  ?Chief Complaint  ?Patient presents with  ? Urinary Frequency  ? ? ?History of Present Illness: ? ?Paul Burch is a 76 y.o. year old male who is seen for further evaluation of urinary frequency, nocturia, and urge incontinence.  His symptoms have been ongoing for 10 years. The pt was dx with Parkinson's approximately 4 months ago and stated current sxs of frequency, nocturia, urgency and urge incontinence have improved approx. 50% since treatment began. He was voiding 3-4 times/night and continued to have urge incontinence.  No history of dysuria, hematuria. Pt denies h/o UTIs, kidney stones. Sleep study in September negative for OSA.  ?IPSS=30 ?PVR= 27 ml ? ?He was given a trial of Myrbetriq 25 mg daily x2 weeks increasing to 50 mg daily.  He continued to have daytime frequency, voiding every hour, nocturia 1-2 times per night, urgency and occasional urge incontinence.  No dysuria or gross hematuria.  He did not see any improvement in his symptoms with the Myrbetriq.  No side effects. ?IPSS = 14. ?PVR = 114 ml. ?He was given a trial of Gemtesa 75 mg daily. ? ?He returns today for follow-up.  He continues to have symptoms of frequency, voiding 6-7 times per day and nocturia 2-3 times per night.  He also continues with urgency and urge incontinence.  No side effects noted from the [place].  No dysuria or gross hematuria. ?IPSS = 12 today. ? ? ?Portions of the above documentation were copied from a prior visit for review purposes only. ? ?Past Medical History:  ?Past Medical History:  ?Diagnosis Date  ? BPH (benign prostatic hyperplasia)   ? Nasal fracture 2011  ? Seasonal  allergies   ? ? ?Past Surgical History:  ?No past surgical history on file. ? ?Allergies:  ?Allergies  ?Allergen Reactions  ? Itraconazole Rash  ? ? ?Family History:  ?No family history on file. ? ?Social History:  ?Social History  ? ?Tobacco Use  ? Smoking status: Never  ? Smokeless tobacco: Never  ?Substance Use Topics  ? Alcohol use: Not Currently  ? Drug use: Not Currently  ? ? ?ROS: ?Constitutional:  Negative for fever, chills, weight loss ?CV: Negative for chest pain, previous MI, hypertension ?Respiratory:  Negative for shortness of breath, wheezing, sleep apnea, frequent cough ?GI:  Negative for nausea, vomiting, bloody stool, GERD ? ?Physical exam: ?BP (!) 177/82   Pulse 60  ?GENERAL APPEARANCE:  Well appearing, well developed, well nourished, NAD ?HEENT:  Atraumatic, normocephalic, oropharynx clear ?NECK:  Supple without lymphadenopathy or thyromegaly ?ABDOMEN:  Soft, non-tender, no masses ?EXTREMITIES:  Moves all extremities well, without clubbing, cyanosis, or edema ?NEUROLOGIC:  Alert and oriented x 3, normal gait, CN II-XII grossly intact ?MENTAL STATUS:  appropriate ?BACK:  Non-tender to palpation, No CVAT ?SKIN:  Warm, dry, and intact ? ? ?Results: ?U/A: dipstick negative ? ?PVR:  250 ml ?

## 2021-10-01 NOTE — Congregational Nurse Program (Signed)
?  Dept: [phone] ? ? ?Congregational Nurse Program Note ? ?Date of Encounter: 10/01/2021 ? ?Past Medical History: ?Past Medical History:  ?Diagnosis Date  ? BPH (benign prostatic hyperplasia)   ? Nasal fracture 2011  ? Seasonal allergies   ? ? ?Encounter Details: ? CNP Questionnaire - 10/01/21 1225   ? ?  ? Questionnaire  ? Do you give verbal consent to treat you today? Yes   ? Location Patient Information systems manager, [place]   ? Visit Setting Church or Organization   ? Patient Status Unknown   ? Insurance Medicare;Medicaid   ? Insurance Referral N/A   ? Medication N/A   ? Medical Provider Yes   ? Screening Referrals N/A   ? Medical Referral N/A   ? Medical Appointment Made N/A   ? Food N/A   ? Transportation N/A   ? Housing/Utilities N/A   ? Interpersonal Safety N/A   ? Intervention Blood pressure   ? ED Visit Averted N/A   ? Life-Saving Intervention Made N/A   ? ?  ?  ? ?  ?Stated he is doing well and taking his medications as prescribed. BP 147/80; P 60. Jenene Slicker RN ? ? ? ?

## 2021-10-08 ENCOUNTER — Other Ambulatory Visit: Payer: Medicare (Managed Care) | Admitting: Urology

## 2021-10-08 NOTE — Progress Notes (Deleted)
? ?Assessment: ?1. Urge incontinence   ?2. Urinary frequency   ?3. Nocturia   ? ? ? ?Plan: ?Given his ongoing urinary symptoms despite medical therapy, recommend further evaluation with cystoscopy.  He may also need further evaluation with urodynamics. ?Return to office in approximately 2 weeks for cystoscopy. ? ?Chief Complaint:  ?No chief complaint on file. ? ? ?History of Present Illness: ? ?Paul Burch is a 76 y.o. year old male who is seen for further evaluation of urinary frequency, nocturia, and urge incontinence.  His symptoms have been ongoing for 10 years. The pt was dx with Parkinson's approximately 4 months ago and stated current sxs of frequency, nocturia, urgency and urge incontinence have improved approx. 50% since treatment began. He was voiding 3-4 times/night and continued to have urge incontinence.  No history of dysuria, hematuria. Pt denies h/o UTIs, kidney stones. Sleep study in September negative for OSA.  ?IPSS=30 ?PVR= 27 ml ? ?He was given a trial of Myrbetriq 25 mg daily x2 weeks increasing to 50 mg daily.  He continued to have daytime frequency, voiding every hour, nocturia 1-2 times per night, urgency and occasional urge incontinence.  No dysuria or gross hematuria.  He did not see any improvement in his symptoms with the Myrbetriq.  No side effects. ?IPSS = 14. ?PVR = 114 ml. ?He was given a trial of Gemtesa 75 mg daily. ? ?At his visit on 09/18/2021, he continued to have symptoms of frequency, voiding 6-7 times per day and nocturia 2-3 times per night.  He also continued with urgency and urge incontinence.  No side effects noted from the [place].  No dysuria or gross hematuria. ?IPSS = 12. ?PVR = 250 mL. ? ?He presents today for further evaluation with cystoscopy. ? ?Portions of the above documentation were copied from a prior visit for review purposes only. ? ?Past Medical History:  ?Past Medical History:  ?Diagnosis Date  ? BPH (benign prostatic hyperplasia)   ? Nasal fracture 2011   ? Seasonal allergies   ? ? ?Past Surgical History:  ?No past surgical history on file. ? ?Allergies:  ?Allergies  ?Allergen Reactions  ? Itraconazole Rash  ? ? ?Family History:  ?No family history on file. ? ?Social History:  ?Social History  ? ?Tobacco Use  ? Smoking status: Never  ? Smokeless tobacco: Never  ?Substance Use Topics  ? Alcohol use: Not Currently  ? Drug use: Not Currently  ? ? ?ROS: ?Constitutional:  Negative for fever, chills, weight loss ?CV: Negative for chest pain, previous MI, hypertension ?Respiratory:  Negative for shortness of breath, wheezing, sleep apnea, frequent cough ?GI:  Negative for nausea, vomiting, bloody stool, GERD ? ?Physical exam: ?There were no vitals taken for this visit. ?GENERAL APPEARANCE:  Well appearing, well developed, well nourished, NAD ?HEENT:  Atraumatic, normocephalic, oropharynx clear ?NECK:  Supple without lymphadenopathy or thyromegaly ?ABDOMEN:  Soft, non-tender, no masses ?EXTREMITIES:  Moves all extremities well, without clubbing, cyanosis, or edema ?NEUROLOGIC:  Alert and oriented x 3, normal gait, CN II-XII grossly intact ?MENTAL STATUS:  appropriate ?BACK:  Non-tender to palpation, No CVAT ?SKIN:  Warm, dry, and intact ? ? ?Results: ?U/A:  ? ?Procedure:  Flexible Cystourethroscopy ? ?Pre-operative Diagnosis: {cysto diagnosis:26394} ? ?Post-operative Diagnosis: {cysto diagnosis:26394} ? ?Anesthesia:  local with lidocaine jelly ? ?Surgical Narrative: ? ?After appropriate informed consent was obtained, the patient was prepped and draped in the usual sterile fashion in the supine position.  The patient was correctly identified and  the proper procedure delineated prior to proceeding.  Sterile lidocaine gel was instilled in the urethra. ?The flexible cystoscope was introduced without difficulty. ? ?Findings: ? ?Anterior urethra: {anterior urethral findings:26395} ? ?Posterior urethra: {post urethral findings:26396} ? ?Bladder: {bladder  findings:26397} ? ?Ureteral orifices: {Normal/Abnormal Appearance:21344::"normal"} ? ?Additional findings: ? ?Saline bladder wash for cytology {WAS/WAS [phone]::"was not"} performed.   ? ?The cystoscope was then removed.  The patient tolerated the procedure well. ? ? ?

## 2021-11-23 NOTE — Congregational Nurse Program (Signed)
?  Dept: [phone] ? ? ?Congregational Nurse Program Note ? ?Date of Encounter: 11/23/2021 ? ?Past Medical History: ?Past Medical History:  ?Diagnosis Date  ? BPH (benign prostatic hyperplasia)   ? Nasal fracture 2011  ? Seasonal allergies   ? ? ?Encounter Details: ? CNP Questionnaire - 11/10/21 1215   ? ?  ? Questionnaire  ? Do you give verbal consent to treat you today? Yes   ? Location Patient Information systems manager, [place]   ? Visit Setting Church or Organization   ? Patient Status Unknown   ? Insurance Medicare;Medicaid   ? Insurance Referral N/A   ? Medication N/A   ? Medical Provider Yes   ? Screening Referrals N/A   ? Medical Referral N/A   ? Medical Appointment Made N/A   ? Food N/A   ? Transportation N/A   ? Housing/Utilities N/A   ? Interpersonal Safety N/A   ? Intervention Blood pressure   ? ED Visit Averted N/A   ? Life-Saving Intervention Made N/A   ? ?  ?  ? ?  ? ? ? ? ?Dept: [phone] ? ? ?Congregational Nurse Program Note ? ?Date of Encounter: 11/23/2021 ? ?Past Medical History: ?Past Medical History:  ?Diagnosis Date  ? BPH (benign prostatic hyperplasia)   ? Nasal fracture 2011  ? Seasonal allergies   ? ? ?Encounter Details: ? CNP Questionnaire - 11/10/21 1215   ? ?  ? Questionnaire  ? Do you give verbal consent to treat you today? Yes   ? Location Patient Information systems manager, [place]   ? Visit Setting Church or Organization   ? Patient Status Unknown   ? Insurance Medicare;Medicaid   ? Insurance Referral N/A   ? Medication N/A   ? Medical Provider Yes   ? Screening Referrals N/A   ? Medical Referral N/A   ? Medical Appointment Made N/A   ? Food N/A   ? Transportation N/A   ? Housing/Utilities N/A   ? Interpersonal Safety N/A   ? Intervention Blood pressure   ? ED Visit Averted N/A   ? Life-Saving Intervention Made N/A   ? ?  ?  ? ?  ?Stated he is doing ok No concerns  or problems. BP 136/78 P 57 ?Jenene Slicker RN ? ? ? ?

## 2021-12-16 ENCOUNTER — Ambulatory Visit (INDEPENDENT_AMBULATORY_CARE_PROVIDER_SITE_OTHER): Payer: Medicare (Managed Care) | Admitting: Urology

## 2021-12-16 VITALS — BP 149/72 | HR 91 | Ht 68.0 in | Wt 185.0 lb

## 2021-12-16 DIAGNOSIS — N138 Other obstructive and reflux uropathy: Secondary | ICD-10-CM | POA: Diagnosis not present

## 2021-12-16 DIAGNOSIS — N401 Enlarged prostate with lower urinary tract symptoms: Secondary | ICD-10-CM

## 2021-12-16 DIAGNOSIS — R351 Nocturia: Secondary | ICD-10-CM

## 2021-12-16 DIAGNOSIS — R350 Frequency of micturition: Secondary | ICD-10-CM

## 2021-12-16 DIAGNOSIS — N3941 Urge incontinence: Secondary | ICD-10-CM

## 2021-12-16 LAB — URINALYSIS, ROUTINE W REFLEX MICROSCOPIC
Bilirubin, UA: NEGATIVE
Glucose, UA: NEGATIVE
Ketones, UA: NEGATIVE
Leukocytes,UA: NEGATIVE
Nitrite, UA: NEGATIVE
Protein,UA: NEGATIVE
RBC, UA: NEGATIVE
Specific Gravity, UA: <1.005 — ABNORMAL LOW (ref 1.005–1.030)
Urobilinogen, Ur: 0.2 mg/dL (ref 0.2–1.0)
pH, UA: 6 (ref 5.0–7.5)

## 2021-12-16 MED ORDER — CIPROFLOXACIN HCL 500 MG PO TABS
500.0000 mg | ORAL_TABLET | Freq: Once | ORAL | Status: AC
  Administered 2021-12-16: 500 mg via ORAL

## 2021-12-16 MED ORDER — TAMSULOSIN HCL 0.4 MG PO CAPS: 0.4000 mg | ORAL_CAPSULE | Freq: Every day | ORAL | 11 refills | Status: DC

## 2021-12-16 NOTE — Progress Notes (Signed)
Frequent    Assessment: 1. Urinary frequency   2. Urge incontinence   3. Nocturia     Plan: Cipro x1 following cystoscopy. I discussed the results of cystoscopy with the patient today.  Given the findings of significant prostate enlargement, I discussed medical management with alpha-blocker therapy.  He would like to try this.  Use and side effects discussed. Tamsulosin 0.4 mg daily. Return to office in 6 weeks.  PSA next visit.  Chief Complaint:  Chief Complaint  Patient presents with   Urinary Frequency    History of Present Illness:  Paul Burch is a 76 y.o. year old male who is seen for further evaluation of urinary frequency, nocturia, and urge incontinence.  His symptoms have been ongoing for 10 years. The pt was dx with Parkinson's and stated current sxs of frequency, nocturia, urgency and urge incontinence improved approx. 50% since treatment began. He was voiding 3-4 times/night and continued to have urge incontinence.  No history of dysuria, hematuria. Pt denies h/o UTIs, kidney stones. Sleep study in September 2022 negative for OSA.  IPSS=30 PVR= 27 ml  He was given a trial of Myrbetriq 25 mg daily x2 weeks increasing to 50 mg daily.  He continued to have daytime frequency, voiding every hour, nocturia 1-2 times per night, urgency and occasional urge incontinence.  No dysuria or gross hematuria.  He did not see any improvement in his symptoms with the Myrbetriq.  No side effects. IPSS = 14. PVR = 114 ml. He was given a trial of Gemtesa 75 mg daily.  He continued to have symptoms of frequency, voiding 6-7 times per day and nocturia 2-3 times per night.  He also continued with urgency and urge incontinence.  No side effects noted from the [place].  No dysuria or gross hematuria. IPSS = 12.  PVR = 250 ml.  He presents today for cystoscopy.  He reports that his urinary symptoms are slightly improved.  He continues to have some frequency voiding every 1-3 hours and nocturia  2-3 times per night.  He does have occasional urgency and urge incontinence.  He is not on any medical therapy at this time.  No dysuria or gross hematuria. IPSS = 8 today.  History obtained through interpreter.  Portions of the above documentation were copied from a prior visit for review purposes only.  Past Medical History:  Past Medical History:  Diagnosis Date   BPH (benign prostatic hyperplasia)    Nasal fracture 2011   Seasonal allergies     Past Surgical History:  No past surgical history on file.  Allergies:  Allergies  Allergen Reactions   Itraconazole Rash    Family History:  No family history on file.  Social History:  Social History   Tobacco Use   Smoking status: Never   Smokeless tobacco: Never  Substance Use Topics   Alcohol use: Not Currently   Drug use: Not Currently    ROS: Constitutional:  Negative for fever, chills, weight loss CV: Negative for chest pain, previous MI, hypertension Respiratory:  Negative for shortness of breath, wheezing, sleep apnea, frequent cough GI:  Negative for nausea, vomiting, bloody stool, GERD  Physical exam: BP (!) 149/72   Pulse 91   Ht 5\' 8"  (1.727 m)   Wt 185 lb (83.9 kg)   BMI 28.13 kg/m  GENERAL APPEARANCE:  Well appearing, well developed, well nourished, NAD HEENT:  Atraumatic, normocephalic, oropharynx clear NECK:  Supple without lymphadenopathy or thyromegaly ABDOMEN:  Soft,  non-tender, no masses EXTREMITIES:  Moves all extremities well, without clubbing, cyanosis, or edema NEUROLOGIC:  Alert and oriented x 3, normal gait, CN II-XII grossly intact MENTAL STATUS:  appropriate BACK:  Non-tender to palpation, No CVAT SKIN:  Warm, dry, and intact   Results: U/A: dipstick negative  Procedure:  Flexible Cystourethroscopy  Pre-operative Diagnosis: Lower urinary tract symptoms  Post-operative Diagnosis: Lower urinary tract symptoms  Anesthesia:  local with lidocaine jelly  Surgical  Narrative:  After appropriate informed consent was obtained, the patient was prepped and draped in the usual sterile fashion in the supine position.  The patient was correctly identified and the proper procedure delineated prior to proceeding.  Sterile lidocaine gel was instilled in the urethra. The flexible cystoscope was introduced without difficulty.  Findings:  Anterior urethra: Normal  Posterior urethra:  lateral lobe enlargement, median lobe with protrusion into bladder, increased vascularity anterior bladder neck  Bladder:  few trabeculations; no mucosal lesions  Ureteral orifices: normal  Additional findings:  none  Saline bladder wash for cytology was not performed.    The cystoscope was then removed.  The patient tolerated the procedure well.

## 2022-01-02 NOTE — Progress Notes (Unsigned)
I, Christoper Fabian, LAT, ATC, am serving as scribe for Dr. Clementeen Graham.  Subjective:    CC: B hip, B knee and mid-back pain  HPI: Pt is a 76 y/o male c/o B knee, B hip and mid-back pain x 4-5 months w/ no known MOI.  Pt locates pain to his mid-lower back, B ant knees and B lateral hips. Additionally he does have some bilateral shoulder pain but wanted to but this issue on the back burner.  He notes he has been doing some home exercises to increase his core strength hips and knees strength which has been helpful.  He notes his back pain occurs with prolonged standing.  His bilateral hip pain is located at the lateral hips and occurs with prolonged standing. His knee pain is located anterior knees and occurs with prolonged sitting and prolonged standing..  Mechanical symptoms: yes in his B knees LE numbness/tingling: no Aggravating factors: Walking (knees and hips); constant pain in the back Treatments tried: topical pain-relieving cream;    Patient mentions that he already has been referred to physical therapy at [facility] starting June 30   Pertinent review of Systems: No fevers or chills  Relevant historical information: Hypertension.  GERD.  Arthritis.   Objective:    Vitals:   01/05/22 1230  BP: 130/80  Pulse: (!) 58  SpO2: 94%   General: Well Developed, well nourished, and in no acute distress.   MSK: Shoulders bilaterally normal-appearing normal motion pain with abduction.  L-spine: Nontender midline normal lumbar motion pain with extension.  Lower extremity strength is intact except noted below.  Hips bilaterally normal. Normal motion. Tender palpation greater trochanter bilaterally mildly. Hip abduction and external rotation strength is mildly diminished bilaterally.  Knees bilaterally normal.  Normal motion with crepitation.  Nontender. Intact strength. Stable ligamentous exam.  Lab and Radiology Results  X-ray images L-spine and bilateral  knees obtained today personally and independently interpreted.  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  No acute fractures are present.  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  No acute fractures.  Left knee: Tricompartmental DJD.  No acute fractures.  Await formal radiology review    Impression and Recommendations:    Assessment and Plan: 76 y.o. male with chronic pain L-spine thought to be due to core weakness and degenerative changes.  Patient is a good candidate for trial of physical therapy.  Patient has a referral to start physical therapy on June 30 at [facility].  I think this is a great idea.  Lateral hip pain thought to be due to trochanteric bursitis.  Again physical therapy referral should be quite helpful here.  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  Quad strengthening should be helpful with physical therapy.  Shoulder pain: This was on a back burner today.  Again physical therapy should be helpful we will focus on this on recheck if needed.  Recheck in 2 months.  Spanish interpreter used.    Orders Placed This Encounter  Procedures   DG Knee AP/LAT W/Sunrise Left    Standing Status:   Future    Number of Occurrences:   1    Standing Expiration Date:   02/04/2022    Order Specific Question:   Reason for Exam (SYMPTOM  OR DIAGNOSIS REQUIRED)    Answer:   L knee pain    Order Specific Question:   Preferred imaging location?  Answer:   [facility]   DG Knee AP/LAT W/Sunrise Right    Standing Status:   Future    Number of Occurrences:   1    Standing Expiration Date:   02/04/2022    Order Specific Question:   Reason for Exam (SYMPTOM  OR DIAGNOSIS REQUIRED)    Answer:   R knee pain    Order Specific Question:   Preferred imaging location?    Answer:   [facility]   DG Lumbar Spine 2-3 Views    Standing Status:   Future    Number of  Occurrences:   1    Standing Expiration Date:   02/04/2022    Order Specific Question:   Reason for Exam (SYMPTOM  OR DIAGNOSIS REQUIRED)    Answer:   low back pain    Order Specific Question:   Preferred imaging location?    Answer:   [facility]   No orders of the defined types were placed in this encounter.   Discussed warning signs or symptoms. Please see discharge instructions. Patient expresses understanding.   The above documentation has been reviewed and is accurate and complete Clementeen Graham, M.D.

## 2022-01-05 ENCOUNTER — Encounter: Payer: Self-pay | Admitting: Family Medicine

## 2022-01-05 ENCOUNTER — Ambulatory Visit: Payer: Self-pay

## 2022-01-05 ENCOUNTER — Ambulatory Visit (INDEPENDENT_AMBULATORY_CARE_PROVIDER_SITE_OTHER): Payer: Medicare (Managed Care) | Admitting: Family Medicine

## 2022-01-05 ENCOUNTER — Ambulatory Visit (INDEPENDENT_AMBULATORY_CARE_PROVIDER_SITE_OTHER): Payer: Medicare (Managed Care)

## 2022-01-05 VITALS — BP 130/80 | HR 58 | Ht 68.0 in | Wt 180.2 lb

## 2022-01-05 DIAGNOSIS — F419 Anxiety disorder, unspecified: Secondary | ICD-10-CM | POA: Insufficient documentation

## 2022-01-05 DIAGNOSIS — M545 Low back pain, unspecified: Secondary | ICD-10-CM

## 2022-01-05 DIAGNOSIS — M25561 Pain in right knee: Secondary | ICD-10-CM | POA: Diagnosis not present

## 2022-01-05 DIAGNOSIS — Z6828 Body mass index (BMI) 28.0-28.9, adult: Secondary | ICD-10-CM | POA: Insufficient documentation

## 2022-01-05 DIAGNOSIS — I10 Essential (primary) hypertension: Secondary | ICD-10-CM | POA: Insufficient documentation

## 2022-01-05 DIAGNOSIS — G8929 Other chronic pain: Secondary | ICD-10-CM

## 2022-01-05 DIAGNOSIS — K219 Gastro-esophageal reflux disease without esophagitis: Secondary | ICD-10-CM | POA: Insufficient documentation

## 2022-01-05 DIAGNOSIS — M25551 Pain in right hip: Secondary | ICD-10-CM

## 2022-01-05 DIAGNOSIS — R32 Unspecified urinary incontinence: Secondary | ICD-10-CM | POA: Insufficient documentation

## 2022-01-05 DIAGNOSIS — M1990 Unspecified osteoarthritis, unspecified site: Secondary | ICD-10-CM | POA: Insufficient documentation

## 2022-01-05 DIAGNOSIS — M25562 Pain in left knee: Secondary | ICD-10-CM

## 2022-01-05 DIAGNOSIS — M25552 Pain in left hip: Secondary | ICD-10-CM

## 2022-01-05 NOTE — Patient Instructions (Addendum)
Good to see you today.  Please get an Xray today before you leave.  Proceed w/ PT at [facility].  Follow-up: 2 months (30 min appt)

## 2022-01-06 NOTE — Progress Notes (Signed)
Right knee x-ray looks normal to radiology

## 2022-01-06 NOTE — Progress Notes (Signed)
Lumbar spine x-ray shows some arthritis changes.

## 2022-01-06 NOTE — Progress Notes (Signed)
Left knee x-ray shows some mild arthritis changes.

## 2022-01-28 ENCOUNTER — Encounter: Payer: Self-pay | Admitting: Urology

## 2022-01-28 ENCOUNTER — Ambulatory Visit (INDEPENDENT_AMBULATORY_CARE_PROVIDER_SITE_OTHER): Payer: Medicare (Managed Care) | Admitting: Urology

## 2022-01-28 VITALS — BP 160/84 | HR 60

## 2022-01-28 DIAGNOSIS — N138 Other obstructive and reflux uropathy: Secondary | ICD-10-CM

## 2022-01-28 DIAGNOSIS — N401 Enlarged prostate with lower urinary tract symptoms: Secondary | ICD-10-CM

## 2022-01-28 LAB — URINALYSIS, ROUTINE W REFLEX MICROSCOPIC
Bilirubin, UA: NEGATIVE
Glucose, UA: NEGATIVE
Ketones, UA: NEGATIVE
Leukocytes,UA: NEGATIVE
Nitrite, UA: NEGATIVE
Protein,UA: NEGATIVE
RBC, UA: NEGATIVE
Specific Gravity, UA: 1.01 (ref 1.005–1.030)
Urobilinogen, Ur: 1 mg/dL (ref 0.2–1.0)
pH, UA: 5.5 (ref 5.0–7.5)

## 2022-01-28 NOTE — Progress Notes (Signed)
Assessment: 1. BPH with obstruction/lower urinary tract symptoms      Plan: Continue tamsulosin 0.4 mg daily. Free and total PSA today Return to office in 3 months  Chief Complaint:  Chief Complaint  Patient presents with   Benign Prostatic Hypertrophy    History of Present Illness:  Paul Burch is a 76 y.o. year old male who is seen for further evaluation of BPH with obstruction and LUTS including urinary frequency, nocturia, and urge incontinence.  His symptoms have been ongoing for 10 years. The pt was dx with Parkinson's and stated current sxs of frequency, nocturia, urgency and urge incontinence improved approx. 50% since treatment began. He was voiding 3-4 times/night and continued to have urge incontinence.  No history of dysuria, hematuria. Pt denies h/o UTIs, kidney stones. Sleep study in September 2022 negative for OSA.  IPSS=30 PVR= 27 ml  He was given a trial of Myrbetriq 25 mg daily x2 weeks increasing to 50 mg daily.  He continued to have daytime frequency, voiding every hour, nocturia 1-2 times per night, urgency and occasional urge incontinence.  No dysuria or gross hematuria.  He did not see any improvement in his symptoms with the Myrbetriq.  No side effects. IPSS = 14. PVR = 114 ml. He was given a trial of Gemtesa 75 mg daily.  He continued to have symptoms of frequency, voiding 6-7 times per day and nocturia 2-3 times per night.  He also continued with urgency and urge incontinence.  No side effects noted from the [place].  No dysuria or gross hematuria. IPSS = 12.  PVR = 250 ml.  At his visit in May 2023, he reported that his urinary symptoms were slightly improved.  He continued to have some frequency voiding every 1-3 hours, nocturia 2-3 times per night, occasional urgency and urge incontinence.  He was not on any medical therapy at that time.  No dysuria or gross hematuria. IPSS = 8. Cystoscopy demonstrated lateral lobe enlargement of the prostate with a  median lobe protruding into the bladder, 1+ bladder trabeculations. He was started on tamsulosin 0.4 mg.  He returns today for follow-up.  He continues on tamsulosin.  He has noted significant improvement in his lower urinary tract symptoms.  He has decreased frequency and nocturia.  He is not having any problems with urgency or incontinence.  No dysuria or gross hematuria.  He is very happy with his current situation. IPSS = 3 today.  History obtained through interpreter.  Portions of the above documentation were copied from a prior visit for review purposes only.  Past Medical History:  Past Medical History:  Diagnosis Date   BPH (benign prostatic hyperplasia)    Nasal fracture 2011   Seasonal allergies     Past Surgical History:  No past surgical history on file.  Allergies:  Allergies  Allergen Reactions   Itraconazole Rash    Family History:  No family history on file.  Social History:  Social History   Tobacco Use   Smoking status: Never   Smokeless tobacco: Never  Substance Use Topics   Alcohol use: Not Currently   Drug use: Not Currently    ROS: Constitutional:  Negative for fever, chills, weight loss CV: Negative for chest pain, previous MI, hypertension Respiratory:  Negative for shortness of breath, wheezing, sleep apnea, frequent cough GI:  Negative for nausea, vomiting, bloody stool, GERD  Physical exam: BP (!) 160/84   Pulse 60  GENERAL APPEARANCE:  Well appearing,  well developed, well nourished, NAD HEENT:  Atraumatic, normocephalic, oropharynx clear NECK:  Supple without lymphadenopathy or thyromegaly ABDOMEN:  Soft, non-tender, no masses EXTREMITIES:  Moves all extremities well, without clubbing, cyanosis, or edema NEUROLOGIC:  Alert and oriented x 3, normal gait, CN II-XII grossly intact MENTAL STATUS:  appropriate BACK:  Non-tender to palpation, No CVAT SKIN:  Warm, dry, and intact   Results: U/A: dipstick negative

## 2022-01-29 LAB — PSA, TOTAL AND FREE
PSA, Free Pct: 16.6 %
PSA, Free: 0.63 ng/mL
Prostate Specific Ag, Serum: 3.8 ng/mL (ref 0.0–4.0)

## 2022-01-29 NOTE — Progress Notes (Signed)
Letter sent.

## 2022-03-09 ENCOUNTER — Ambulatory Visit: Payer: Medicare (Managed Care) | Admitting: Family Medicine

## 2022-03-09 NOTE — Progress Notes (Deleted)
   I, Philbert Riser, LAT, ATC acting as a scribe for Clementeen Graham, MD.  YASSIN SCALES is a 76 y.o. male who presents to [facility] today for f/u chronic LBP, bilat knee, and bilat hip pain. Pt was last seen by Dr. Denyse Amass on 01/05/22 and was advised to proceed to [facility] PT, as previously referred. Today, pt reports   Dx imaging: 01/05/22 L-spine, R & L knee XR  Pertinent review of systems: ***  Relevant historical information: ***   Exam:  There were no vitals taken for this visit. General: Well Developed, well nourished, and in no acute distress.   MSK: ***    Lab and Radiology Results No results found for this or any previous visit (from the past 72 hour(s)). No results found.     Assessment and Plan: 76 y.o. male with ***   PDMP not reviewed this encounter. No orders of the defined types were placed in this encounter.  No orders of the defined types were placed in this encounter.    Discussed warning signs or symptoms. Please see discharge instructions. Patient expresses understanding.   ***

## 2022-03-12 NOTE — Congregational Nurse Program (Signed)
  Dept: [phone]   Congregational Nurse Program Note  Date of Encounter: 03/12/2022  Past Medical History: Past Medical History:  Diagnosis Date   BPH (benign prostatic hyperplasia)    Nasal fracture 2011   Seasonal allergies     Encounter Details:  CNP Questionnaire - 03/11/22 1245       Questionnaire   Do you give verbal consent to treat you today? Yes    Location Patient Information systems manager, [employer] or Organization    Patient Status Unknown    Insurance Medicare;Medicaid    Insurance Referral N/A    Medication N/A    Medical Provider Yes    Screening Referrals N/A    Medical Referral N/A    Medical Appointment Made N/A    Food N/A    Transportation N/A    Housing/Utilities N/A    Interpersonal Safety N/A    Intervention Blood pressure    ED Visit Averted N/A    Life-Saving Intervention Made N/A           Stated everything is going well. No complaints or concerns. BP 150/ 70 HR 54E  Jenene Slicker RN

## 2022-04-29 ENCOUNTER — Encounter: Payer: Self-pay | Admitting: Urology

## 2022-04-29 ENCOUNTER — Ambulatory Visit (INDEPENDENT_AMBULATORY_CARE_PROVIDER_SITE_OTHER): Payer: Medicare (Managed Care) | Admitting: Urology

## 2022-04-29 VITALS — BP 151/76 | HR 74 | Ht 68.0 in | Wt 182.0 lb

## 2022-04-29 DIAGNOSIS — N529 Male erectile dysfunction, unspecified: Secondary | ICD-10-CM | POA: Diagnosis not present

## 2022-04-29 DIAGNOSIS — N138 Other obstructive and reflux uropathy: Secondary | ICD-10-CM

## 2022-04-29 DIAGNOSIS — N401 Enlarged prostate with lower urinary tract symptoms: Secondary | ICD-10-CM

## 2022-04-29 MED ORDER — SILDENAFIL CITRATE 20 MG PO TABS: ORAL_TABLET | ORAL | 11 refills | Status: AC

## 2022-04-29 NOTE — Progress Notes (Signed)
Assessment: 1. BPH with obstruction/lower urinary tract symptoms   2. Organic impotence     Plan: Continue tamsulosin 0.4 mg daily. Trial of sildenafil 20 mg 3-5 tabs prn intercourse.  Rx sent.  Use and side effects discussed. Return to office in 3 months  Chief Complaint:  Chief Complaint  Patient presents with   Benign Prostatic Hypertrophy    History of Present Illness:  Paul Burch is a 76 y.o. year old male who is seen for further evaluation of BPH with obstruction and LUTS including urinary frequency, nocturia, and urge incontinence.  His symptoms have been ongoing for 10 years. The pt was dx with Parkinson's and stated current sxs of frequency, nocturia, urgency and urge incontinence improved approx. 50% since treatment began. He was voiding 3-4 times/night and continued to have urge incontinence.  No history of dysuria, hematuria. Pt denies h/o UTIs, kidney stones. Sleep study in September 2022 negative for OSA.  IPSS=30 PVR= 27 ml  He was given a trial of Myrbetriq 25 mg daily x2 weeks increasing to 50 mg daily.  He continued to have daytime frequency, voiding every hour, nocturia 1-2 times per night, urgency and occasional urge incontinence.  No dysuria or gross hematuria.  He did not see any improvement in his symptoms with the Myrbetriq.  No side effects. IPSS = 14. PVR = 114 ml. He was given a trial of Gemtesa 75 mg daily.  He continued to have symptoms of frequency, voiding 6-7 times per day and nocturia 2-3 times per night.  He also continued with urgency and urge incontinence.  No side effects noted from the [place].  No dysuria or gross hematuria. IPSS = 12.  PVR = 250 ml.  At his visit in May 2023, he reported that his urinary symptoms were slightly improved.  He continued to have some frequency voiding every 1-3 hours, nocturia 2-3 times per night, occasional urgency and urge incontinence.  He was not on any medical therapy at that time.  No dysuria or gross  hematuria. IPSS = 8. Cystoscopy demonstrated lateral lobe enlargement of the prostate with a median lobe protruding into the bladder, 1+ bladder trabeculations. He was started on tamsulosin 0.4 mg. At his visit in July 2023, he noted significant improvement in his lower urinary tract symptoms with decreased frequency and nocturia.  He was not having any problems with urgency or incontinence.  No dysuria or gross hematuria.   IPSS = 3.  PSA from 7/23: 3.8 with 16.6% free  He returns today for follow-up.  He continues on tamsulosin 0.4 mg daily.  His urinary symptoms remain stable.  He is not having any dysuria or gross hematuria. IPSS = 5. He has noted problems achieving and maintaining erections.  No decrease in his libido.  No pain or curvature.  He does have occasional early morning erections.  He has not tried any medical therapy to date.  Portions of the above documentation were copied from a prior visit for review purposes only.  Past Medical History:  Past Medical History:  Diagnosis Date   BPH (benign prostatic hyperplasia)    Nasal fracture 2011   Seasonal allergies     Past Surgical History:  No past surgical history on file.  Allergies:  Allergies  Allergen Reactions   Itraconazole Rash    Family History:  No family history on file.  Social History:  Social History   Tobacco Use   Smoking status: Never   Smokeless tobacco: Never  Substance Use Topics   Alcohol use: Not Currently   Drug use: Not Currently    ROS: Constitutional:  Negative for fever, chills, weight loss CV: Negative for chest pain, previous MI, hypertension Respiratory:  Negative for shortness of breath, wheezing, sleep apnea, frequent cough GI:  Negative for nausea, vomiting, bloody stool, GERD  Physical exam: BP (!) 151/76   Pulse 74   Ht 5\' 8"  (1.727 m)   Wt 182 lb (82.6 kg)   BMI 27.67 kg/m  GENERAL APPEARANCE:  Well appearing, well developed, well nourished, NAD HEENT:   Atraumatic, normocephalic, oropharynx clear NECK:  Supple without lymphadenopathy or thyromegaly ABDOMEN:  Soft, non-tender, no masses EXTREMITIES:  Moves all extremities well, without clubbing, cyanosis, or edema NEUROLOGIC:  Alert and oriented x 3, normal gait, CN II-XII grossly intact MENTAL STATUS:  appropriate BACK:  Non-tender to palpation, No CVAT SKIN:  Warm, dry, and intact   Results: U/A: Dipstick negative

## 2022-04-30 LAB — URINALYSIS, ROUTINE W REFLEX MICROSCOPIC
Bilirubin, UA: NEGATIVE
Glucose, UA: NEGATIVE
Ketones, UA: NEGATIVE
Leukocytes,UA: NEGATIVE
Nitrite, UA: NEGATIVE
Protein,UA: NEGATIVE
RBC, UA: NEGATIVE
Specific Gravity, UA: 1.01 (ref 1.005–1.030)
Urobilinogen, Ur: 1 mg/dL (ref 0.2–1.0)
pH, UA: 5.5 (ref 5.0–7.5)

## 2022-10-19 ENCOUNTER — Ambulatory Visit (INDEPENDENT_AMBULATORY_CARE_PROVIDER_SITE_OTHER): Payer: Medicare (Managed Care) | Admitting: Urology

## 2022-10-19 ENCOUNTER — Encounter: Payer: Self-pay | Admitting: Urology

## 2022-10-19 VITALS — BP 156/77 | HR 57

## 2022-10-19 DIAGNOSIS — N138 Other obstructive and reflux uropathy: Secondary | ICD-10-CM | POA: Diagnosis not present

## 2022-10-19 DIAGNOSIS — R3129 Other microscopic hematuria: Secondary | ICD-10-CM | POA: Diagnosis not present

## 2022-10-19 DIAGNOSIS — N401 Enlarged prostate with lower urinary tract symptoms: Secondary | ICD-10-CM

## 2022-10-19 DIAGNOSIS — N5201 Erectile dysfunction due to arterial insufficiency: Secondary | ICD-10-CM

## 2022-10-19 NOTE — Progress Notes (Unsigned)
10/19/2022 2:36 PM   Paul Burch Dec 11, 1945 [id number]  Referring provider: No referring provider defined for this encounter.  No chief complaint on file.   HPI:  Follow-up-former patient of Dr. Felipa Eth-  1) BPH with lower urinary tract symptoms-10+ years-history of Parkinson's.  AUA symptom score was 30.  PVR 27 mL.  He tried Myrbetriq 25 to 50 mg.  No improvement.  He tried Gemtesa 75 mg daily.  PVR was 250 mL.    May 2023, Cystoscopy demonstrated lateral lobe enlargement of the prostate with a median lobe protruding into the bladder, 1+ bladder trabeculations. He was started on tamsulosin 0.4 mg.  AUA symptom score down to 3-5.  PSA July 2023 was 3.8.  2) ED-he took sildenafil 20 mg tablets 3-5 tabs as needed. Doesn't use it every time.   Today, seen for the above.  His AUA symptom score is 15. He is on tamsulosin 0.4 mg daily. He had one episode of urgency and UUI. Usually doesn't happen.   He has MH today with 3-5 rbc. No gross hematuria or dysuria. Non-smoking.   He was in packaging in a warehouse - lotions.    PMH: Past Medical History:  Diagnosis Date   BPH (benign prostatic hyperplasia)    Nasal fracture 2011   Seasonal allergies     Surgical History: No past surgical history on file.  Home Medications:  Allergies as of 10/19/2022       Reactions   Itraconazole Rash        Medication List        Accurate as of October 19, 2022  2:36 PM. If you have any questions, ask your nurse or doctor.          aspirin EC 81 MG tablet Take by mouth.   benztropine 1 MG tablet Commonly known as: COGENTIN Take by mouth.   celecoxib 200 MG capsule Commonly known as: CELEBREX Take 200 mg by mouth daily.   HYDROcodone-acetaminophen 5-325 MG tablet Commonly known as: NORCO/VICODIN Take by mouth.   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   losartan 50 MG  tablet Commonly known as: COZAAR 1 tablet   metformin 500 MG (OSM) 24 hr tablet Commonly known as: FORTAMET Take 500 mg by mouth daily with breakfast.   naproxen 500 MG tablet Commonly known as: NAPROSYN Take by mouth.   omeprazole 40 MG capsule Commonly known as: PRILOSEC Take 40 mg by mouth every morning.   sildenafil 20 MG tablet Commonly known as: REVATIO Take 3-5 tablets by mouth 30-60 minutes before intercourse   tamsulosin 0.4 MG Caps capsule Commonly known as: FLOMAX Take 1 capsule (0.4 mg total) by mouth daily.   terbinafine 250 MG tablet Commonly known as: LAMISIL Take 1 tablet by mouth daily.        Allergies:  Allergies  Allergen Reactions   Itraconazole Rash    Family History: No family history on file.  Social History:  reports that he has never smoked. He has never used smokeless tobacco. He reports that he does not currently use alcohol. He reports that he does not currently use drugs.   Physical Exam: BP (!) 156/77   Pulse (!) 57   Constitutional:  Alert and oriented, No acute distress. HEENT:  AT, moist mucus membranes.  Trachea midline, no masses. Cardiovascular: No clubbing, cyanosis, or edema. Respiratory: Normal respiratory effort, no increased work of breathing. GI:  Abdomen is soft, nontender, nondistended, no abdominal masses GU: No CVA tenderness Skin: No rashes, bruises or suspicious lesions. Neurologic: Grossly intact, no focal deficits, moving all 4 extremities. Psychiatric: Normal mood and affect.  Laboratory Data: PSA reviewed.  Prior urology notes reviewed.  Urinalysis    Component Value Date/Time   APPEARANCEUR Clear 04/29/2022 0944   GLUCOSEU Negative 04/29/2022 0944   BILIRUBINUR Negative 04/29/2022 0944   PROTEINUR Negative 04/29/2022 0944   NITRITE Negative 04/29/2022 0944   LEUKOCYTESUR Negative 04/29/2022 0944    Lab Results  Component Value Date   LABMICR Comment 04/29/2022      Assessment & Plan:     1. BPH with obstruction/lower urinary tract symptoms Cont tamsulosin. Consider adding back OAB meds or daily pde5i. CT to assess bladder and prostate.   - Urinalysis, Routine w reflex microscopic  2. ED - cont sildenafil   3> MH - cysto benign 11 mo ago. Check CT and consider repeat cysto.    No follow-ups on file.  Festus Aloe, MD  [facility]  [address] [place],  [address] [phone]

## 2022-10-20 LAB — URINALYSIS, ROUTINE W REFLEX MICROSCOPIC
Bilirubin, UA: NEGATIVE
Glucose, UA: NEGATIVE
Ketones, UA: NEGATIVE
Leukocytes,UA: NEGATIVE
Nitrite, UA: NEGATIVE
Protein,UA: NEGATIVE
Specific Gravity, UA: 1.01 (ref 1.005–1.030)
Urobilinogen, Ur: 1 mg/dL (ref 0.2–1.0)
pH, UA: 6.5 (ref 5.0–7.5)

## 2022-10-20 LAB — MICROSCOPIC EXAMINATION: Bacteria, UA: NONE SEEN

## 2022-12-17 ENCOUNTER — Ambulatory Visit (HOSPITAL_COMMUNITY): Payer: Medicare (Managed Care)

## 2023-01-18 ENCOUNTER — Ambulatory Visit: Payer: Medicare (Managed Care) | Admitting: Urology

## 2023-02-11 ENCOUNTER — Ambulatory Visit (HOSPITAL_COMMUNITY)
Admission: RE | Admit: 2023-02-11 | Discharge: 2023-02-11 | Disposition: A | Discharge status: Home / Self Care | Payer: Medicare (Managed Care) | Source: Ambulatory Visit | Attending: Urology | Admitting: Urology

## 2023-02-11 DIAGNOSIS — R3129 Other microscopic hematuria: Secondary | ICD-10-CM | POA: Insufficient documentation

## 2023-02-11 LAB — POCT I-STAT CREATININE: Creatinine, Ser: 1.1 mg/dL (ref 0.61–1.24)

## 2023-02-11 MED ORDER — IOHEXOL 300 MG/ML  SOLN
125.0000 mL | Freq: Once | INTRAMUSCULAR | Status: AC | PRN
  Administered 2023-02-11: 100 mL via INTRAVENOUS

## 2023-02-22 ENCOUNTER — Ambulatory Visit (INDEPENDENT_AMBULATORY_CARE_PROVIDER_SITE_OTHER): Payer: Medicare (Managed Care) | Admitting: Urology

## 2023-02-22 ENCOUNTER — Encounter: Payer: Self-pay | Admitting: Urology

## 2023-02-22 VITALS — BP 139/81 | HR 54

## 2023-02-22 DIAGNOSIS — N529 Male erectile dysfunction, unspecified: Secondary | ICD-10-CM | POA: Diagnosis not present

## 2023-02-22 DIAGNOSIS — N138 Other obstructive and reflux uropathy: Secondary | ICD-10-CM | POA: Diagnosis not present

## 2023-02-22 DIAGNOSIS — N401 Enlarged prostate with lower urinary tract symptoms: Secondary | ICD-10-CM

## 2023-02-22 LAB — URINALYSIS, ROUTINE W REFLEX MICROSCOPIC
Bilirubin, UA: NEGATIVE
Glucose, UA: NEGATIVE
Ketones, UA: NEGATIVE
Leukocytes,UA: NEGATIVE
Nitrite, UA: NEGATIVE
Protein,UA: NEGATIVE
RBC, UA: NEGATIVE
Specific Gravity, UA: 1.01 (ref 1.005–1.030)
Urobilinogen, Ur: 0.2 mg/dL (ref 0.2–1.0)
pH, UA: 7 (ref 5.0–7.5)

## 2023-02-22 LAB — BLADDER SCAN AMB NON-IMAGING: Scan Result: 178

## 2023-02-22 MED ORDER — TADALAFIL 20 MG PO TABS: 20.0000 mg | ORAL_TABLET | Freq: Every day | ORAL | 0 refills | Status: AC | PRN

## 2023-02-22 MED ORDER — FINASTERIDE 5 MG PO TABS: 5.0000 mg | ORAL_TABLET | Freq: Every day | ORAL | 3 refills | Status: DC

## 2023-02-22 NOTE — Progress Notes (Unsigned)
02/22/2023 11:10 AM   Paul Burch 1946-03-21 [id number]  Referring provider: No referring provider defined for this encounter.  No chief complaint on file.   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  AUA symptom score was 30.  PVR 27 mL.  He tried Myrbetriq 25 to 50 mg.  No improvement.  He tried Gemtesa 75 mg daily.  PVR was 250 mL.     May 2023, Cystoscopy demonstrated lateral lobe enlargement of the prostate with a median lobe protruding into the bladder, 1+ bladder trabeculations. He was started on tamsulosin 0.4 mg.  AUA symptom score down to 3-5.   PSA July 2023 was 3.8.   2) ED-he took sildenafil 20 mg tablets 3-5 tabs as needed. Doesn't use it every time.    Today, seen for the above.  His AUA symptom score is 15. He is on tamsulosin 0.4 mg daily. He had one episode of urgency and UUI. Usually doesn't happen. He ran out of tamsulosin and seems to have less frequency. AUASS = 7. PVR 178 ml.    He has MH noted Apr 2024. He had 3-5 rbc. Non-smoking. Aug 2024 CT benign with 160 g prostate and sizeable median lobe. No dysuria or gross hematuria.   Sildenafil did not help his erection.   UA today clear.    He was in packaging in a warehouse - lotions.    PMH: Past Medical History:  Diagnosis Date   BPH (benign prostatic hyperplasia)    Nasal fracture 2011   Seasonal allergies     Surgical History: No past surgical history on file.  Home Medications:  Allergies as of 02/22/2023       Reactions   Itraconazole Rash        Medication List        Accurate as of February 22, 2023 11:10 AM. If you have any questions, ask your nurse or doctor.          aspirin EC 81 MG tablet Take by mouth.   benztropine 1 MG tablet Commonly known as: COGENTIN Take by mouth.   celecoxib 200 MG capsule Commonly known as: CELEBREX Take 200 mg by mouth daily.   HYDROcodone-acetaminophen 5-325 MG tablet Commonly  known as: NORCO/VICODIN Take by mouth.   itraconazole 100 MG capsule Commonly known as: SPORANOX   losartan 50 MG tablet Commonly known as: COZAAR 1 tablet   metformin 500 MG (OSM) 24 hr tablet Commonly known as: FORTAMET Take 500 mg by mouth daily with breakfast.   naproxen 500 MG tablet Commonly known as: NAPROSYN Take by mouth.   omeprazole 40 MG capsule Commonly known as: PRILOSEC Take 40 mg by mouth every morning.   sildenafil 20 MG tablet Commonly known as: REVATIO Take 3-5 tablets by mouth 30-60 minutes before intercourse   tamsulosin 0.4 MG Caps capsule Commonly known as: FLOMAX Take 1 capsule (0.4 mg total) by mouth daily.   terbinafine 250 MG tablet Commonly known as: LAMISIL Take 1 tablet by mouth daily.        Allergies:  Allergies  Allergen Reactions   Itraconazole Rash    Family History: No family history on file.  Social History:  reports that he has never smoked. He has never used smokeless tobacco. He reports that he does not currently use alcohol. He reports that he does not currently use drugs.   Physical Exam: BP 139/81   Pulse Marland Kitchen)  54   Constitutional:  Alert and oriented, No acute distress. HEENT: [place] AT, moist mucus membranes.  Trachea midline, no masses. Cardiovascular: No clubbing, cyanosis, or edema. Respiratory: Normal respiratory effort, no increased work of breathing. GI: Abdomen is soft, nontender, nondistended, no abdominal masses GU: No CVA tenderness Skin: No rashes, bruises or suspicious lesions. Neurologic: Grossly intact, no focal deficits, moving all 4 extremities. Psychiatric: Normal mood and affect. GU: prostate 80 g and smooth - no hard area or nodule   Laboratory Data: No results found for: "WBC", "HGB", "HCT", "MCV", "PLT"  Lab Results  Component Value Date   CREATININE 1.10 02/11/2023    No results found for: "PSA"  No results found for: "TESTOSTERONE"  No results found for: "HGBA1C"  Urinalysis     Component Value Date/Time   APPEARANCEUR Clear 10/19/2022 1436   GLUCOSEU Negative 10/19/2022 1436   BILIRUBINUR Negative 10/19/2022 1436   PROTEINUR Negative 10/19/2022 1436   NITRITE Negative 10/19/2022 1436   LEUKOCYTESUR Negative 10/19/2022 1436    Lab Results  Component Value Date   LABMICR See below: 10/19/2022   WBCUA 0-5 10/19/2022   LABEPIT 0-10 10/19/2022   BACTERIA None seen 10/19/2022      Assessment & Plan:    1. BPH with obstruction/lower urinary tract symptoms Reviewed CT scand discussed nature r/b/a to 5ari. Disc FDA warn. He will start.  - Urinalysis, Routine w reflex microscopic - BLADDER SCAN AMB NON-IMAGING  2> ED - trial of tadalafil   No follow-ups on file.  Jerilee Field, MD  [facility]  [address] [place], [place] [address] [phone]

## 2023-05-24 ENCOUNTER — Ambulatory Visit: Payer: Medicare (Managed Care) | Admitting: Urology

## 2023-05-24 ENCOUNTER — Encounter: Payer: Self-pay | Admitting: Urology

## 2023-05-24 VITALS — BP 134/63 | HR 56

## 2023-05-24 DIAGNOSIS — N5201 Erectile dysfunction due to arterial insufficiency: Secondary | ICD-10-CM | POA: Diagnosis not present

## 2023-05-24 DIAGNOSIS — N138 Other obstructive and reflux uropathy: Secondary | ICD-10-CM

## 2023-05-24 DIAGNOSIS — N401 Enlarged prostate with lower urinary tract symptoms: Secondary | ICD-10-CM | POA: Diagnosis not present

## 2023-05-24 DIAGNOSIS — R351 Nocturia: Secondary | ICD-10-CM | POA: Diagnosis not present

## 2023-05-24 NOTE — Progress Notes (Unsigned)
05/24/2023 11:06 AM   Paul Burch Jul 25, 1945 [id number]  Referring provider: No referring provider defined for this encounter.  No chief complaint on file.   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 Prostate was 160 g with a [place] on [place] [address]. IPSS was 30. PVR 27 mL.  He tried Myrbetriq 25 to 50 mg.  No improvement.  He tried Gemtesa 75 mg daily.  PVR was 250 mL.     May 2023, Cystoscopy demonstrated lateral lobe enlargement of the prostate with a median lobe protruding into the bladder, 1+ bladder trabeculations. He was started on tamsulosin 0.4 mg.  AUA symptom score down to 3 but back to 15 on tamsulosin then IPSS 7 with PVR 178 ml. He had uregency and UUI episode.    PSA July 2023 was 3.8. DRE benign Aug 2024. 80 g.    2) ED-he took sildenafil 20 mg tablets 3-5 tabs as needed. Doesn't use it every time. PDE5i not working as well.    3) MH - noted Apr 2024. Cysto benign in 2023. CT benign 2024. F/u UA clear.   Today, seen for the above. We added finasteride to tamsulosin in Aug 2024. IPSS = 6. Noc x 1-2.   Sildenafil did not help his erection. He tried tadfalafil. Tadalafil 20 mg worked well.    He was in packaging in a warehouse - lotions.  PMH: Past Medical History:  Diagnosis Date   BPH (benign prostatic hyperplasia)    Nasal fracture 2011   Seasonal allergies     Surgical History: No past surgical history on file.  Home Medications:  Allergies as of 05/24/2023       Reactions   Itraconazole Rash        Medication List        Accurate as of May 24, 2023 11:06 AM. If you have any questions, ask your nurse or doctor.          aspirin EC 81 MG tablet Take by mouth.   benztropine 1 MG tablet Commonly known as: COGENTIN Take by mouth.   celecoxib 200 MG capsule Commonly known as: CELEBREX Take 200 mg by mouth daily.   finasteride 5 MG tablet Commonly known as:  PROSCAR Take 1 tablet (5 mg total) by mouth daily.   HYDROcodone-acetaminophen 5-325 MG tablet Commonly known as: NORCO/VICODIN Take by mouth.   itraconazole 100 MG capsule Commonly known as: SPORANOX   losartan 50 MG tablet Commonly known as: COZAAR 1 tablet   metformin 500 MG (OSM) 24 hr tablet Commonly known as: FORTAMET Take 500 mg by mouth daily with breakfast.   naproxen 500 MG tablet Commonly known as: NAPROSYN Take by mouth.   omeprazole 40 MG capsule Commonly known as: PRILOSEC Take 40 mg by mouth every morning.   sildenafil 20 MG tablet Commonly known as: REVATIO Take 3-5 tablets by mouth 30-60 minutes before intercourse   tadalafil 20 MG tablet Commonly known as: CIALIS Take 1 tablet (20 mg total) by mouth daily as needed for erectile dysfunction.   terbinafine 250 MG tablet Commonly known as: LAMISIL Take 1 tablet by mouth daily.        Allergies:  Allergies  Allergen Reactions   Itraconazole Rash    Family History: No family history on file.  Social History:  reports that he has never smoked. He has never used smokeless tobacco. He reports  that he does not currently use alcohol. He reports that he does not currently use drugs.   Physical Exam: There were no vitals taken for this visit.  Constitutional:  Alert and oriented, No acute distress. HEENT: [place] AT, moist mucus membranes.  Trachea midline, no masses. Cardiovascular: No clubbing, cyanosis, or edema. Respiratory: Normal respiratory effort, no increased work of breathing. GI: Abdomen is soft, nontender, nondistended, no abdominal masses GU: No CVA tenderness Skin: No rashes, bruises or suspicious lesions. Neurologic: Grossly intact, no focal deficits, moving all 4 extremities. Psychiatric: Normal mood and affect.  Laboratory Data: No results found for: "WBC", "HGB", "HCT", "MCV", "PLT"  Lab Results  Component Value Date   CREATININE 1.10 02/11/2023    No results found for:  "PSA"  No results found for: "TESTOSTERONE"  No results found for: "HGBA1C"  Urinalysis    Component Value Date/Time   APPEARANCEUR Clear 02/22/2023 1120   GLUCOSEU Negative 02/22/2023 1120   BILIRUBINUR Negative 02/22/2023 1120   PROTEINUR Negative 02/22/2023 1120   NITRITE Negative 02/22/2023 1120   LEUKOCYTESUR Negative 02/22/2023 1120    Lab Results  Component Value Date   LABMICR Comment 02/22/2023   WBCUA 0-5 10/19/2022   LABEPIT 0-10 10/19/2022   BACTERIA None seen 10/19/2022    Pertinent Imaging: N/a   Assessment & Plan:    Bph - continue finasteride and tamsulosin  ED - cont tadalafil   PSA was sent   No follow-ups on file.  Jerilee Field, MD  [facility]  [address] [place], [place] [address] [phone]

## 2023-05-25 LAB — PSA: Prostate Specific Ag, Serum: 2.6 ng/mL (ref 0.0–4.0)

## 2023-11-22 ENCOUNTER — Ambulatory Visit: Payer: Medicare (Managed Care) | Admitting: Urology

## 2024-02-24 ENCOUNTER — Other Ambulatory Visit: Payer: Self-pay

## 2024-02-24 DIAGNOSIS — N138 Other obstructive and reflux uropathy: Secondary | ICD-10-CM

## 2024-02-24 MED ORDER — FINASTERIDE 5 MG PO TABS
5.0000 mg | ORAL_TABLET | Freq: Every day | ORAL | 0 refills | Status: DC
Start: 2024-02-24 — End: 2024-05-08

## 2024-05-08 ENCOUNTER — Encounter: Payer: Self-pay | Admitting: Urology

## 2024-05-08 ENCOUNTER — Ambulatory Visit (INDEPENDENT_AMBULATORY_CARE_PROVIDER_SITE_OTHER): Payer: Medicare (Managed Care) | Admitting: Urology

## 2024-05-08 VITALS — BP 154/81 | HR 61

## 2024-05-08 DIAGNOSIS — N401 Enlarged prostate with lower urinary tract symptoms: Secondary | ICD-10-CM | POA: Diagnosis not present

## 2024-05-08 DIAGNOSIS — R3129 Other microscopic hematuria: Secondary | ICD-10-CM

## 2024-05-08 DIAGNOSIS — N529 Male erectile dysfunction, unspecified: Secondary | ICD-10-CM

## 2024-05-08 LAB — URINALYSIS, ROUTINE W REFLEX MICROSCOPIC
Bilirubin, UA: NEGATIVE
Glucose, UA: NEGATIVE
Ketones, UA: NEGATIVE
Leukocytes,UA: NEGATIVE
Nitrite, UA: NEGATIVE
Protein,UA: NEGATIVE
RBC, UA: NEGATIVE
Specific Gravity, UA: <1.005 — ABNORMAL LOW (ref 1.005–1.030)
Urobilinogen, Ur: 0.2 mg/dL (ref 0.2–1.0)
pH, UA: 6 (ref 5.0–7.5)

## 2024-05-08 MED ORDER — FINASTERIDE 5 MG PO TABS
5.0000 mg | ORAL_TABLET | Freq: Every day | ORAL | 3 refills | Status: AC
Start: 2024-05-08 — End: ?

## 2024-05-08 NOTE — Progress Notes (Unsigned)
 05/08/2024 11:47 AM   Paul Burch 03/08/1946 [id number]  Referring provider: No referring provider defined for this encounter.  No chief complaint on file.   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 Prostate was 160 g with a median lobe on CT in [address]. IPSS was 30. PVR 27 mL.  He tried Myrbetriq  25 to 50 mg.  No improvement.  He tried Gemtesa  75 mg daily.  PVR was 250 mL.     May 2023, Cystoscopy demonstrated lateral lobe enlargement of the prostate with a median lobe protruding into the bladder, 1+ bladder trabeculations. He was started on tamsulosin  0.4 mg.  AUA symptom score down to 3 but back to 15 on tamsulosin  then IPSS 7 with PVR 178 ml. He had uregency and UUI episode.    PSA July 2023 was 3.8. DRE benign Aug 2024. 80 g. We added finasteride  to tamsulosin  in Aug 2024. IPSS = 6. Noc x 1-2.    2) ED-he took sildenafil  20 mg tablets 3-5 tabs as needed. Doesn't use it every time. PDE5i not working as well. Sildenafil  did not help his erection. He tried tadfalafil. Tadalafil  20 mg worked well.    3) MH - noted Apr 2024. Cysto benign in 2023. CT benign 2024. F/u UA clear.    Today, seen for the above. Nov 2024 PSA 2.6 - three months p starting finasteride . On finasteride . Off tamsulosin . No voiding complaints. Noc x 3-4. Better. No longer needs pads. IPSS 10. Parkinson stable.     He was in packaging in a warehouse - lotions.  PMH: Past Medical History:  Diagnosis Date   BPH (benign prostatic hyperplasia)    Nasal fracture 2011   Seasonal allergies     Surgical History: No past surgical history on file.  Home Medications:  Allergies as of 05/08/2024       Reactions   Itraconazole Rash        Medication List        Accurate as of May 08, 2024 11:47 AM. If you have any questions, ask your nurse or doctor.          aspirin EC 81 MG tablet Take by mouth.   benztropine 1 MG tablet Commonly known  as: COGENTIN Take by mouth.   celecoxib 200 MG capsule Commonly known as: CELEBREX Take 200 mg by mouth daily.   finasteride  5 MG tablet Commonly known as: PROSCAR  Take 1 tablet (5 mg total) by mouth daily.   HYDROcodone-acetaminophen 5-325 MG tablet Commonly known as: NORCO/VICODIN Take by mouth.   itraconazole 100 MG capsule Commonly known as: SPORANOX   losartan 50 MG tablet Commonly known as: COZAAR 1 tablet   metformin 500 MG (OSM) 24 hr tablet Commonly known as: FORTAMET Take 500 mg by mouth daily with breakfast.   naproxen 500 MG tablet Commonly known as: NAPROSYN Take by mouth.   omeprazole 40 MG capsule Commonly known as: PRILOSEC Take 40 mg by mouth every morning.   sildenafil  20 MG tablet Commonly known as: REVATIO  Take 3-5 tablets by mouth 30-60 minutes before intercourse   tadalafil  20 MG tablet Commonly known as: CIALIS  Take 1 tablet (20 mg total) by mouth daily as needed for erectile dysfunction.   terbinafine  250 MG tablet Commonly known as: LAMISIL  Take 1 tablet by mouth daily.        Allergies:  Allergies  Allergen Reactions   Itraconazole Rash  Family History: No family history on file.  Social History:  reports that he has never smoked. He has never used smokeless tobacco. He reports that he does not currently use alcohol. He reports that he does not currently use drugs.   Physical Exam: There were no vitals taken for this visit.  Constitutional:  Alert and oriented, No acute distress. HEENT: [place] AT, moist mucus membranes.  Trachea midline, no masses. Cardiovascular: No clubbing, cyanosis, or edema. Respiratory: Normal respiratory effort, no increased work of breathing. GI: Abdomen is soft, nontender, nondistended, no abdominal masses GU: No CVA tenderness Lymph: No cervical or inguinal lymphadenopathy. Skin: No rashes, bruises or suspicious lesions. Neurologic: Grossly intact, no focal deficits, moving all 4  extremities. Psychiatric: Normal mood and affect.  Laboratory Data: No results found for: WBC, HGB, HCT, MCV, PLT  Lab Results  Component Value Date   CREATININE 1.10 02/11/2023    No results found for: PSA  No results found for: TESTOSTERONE  No results found for: HGBA1C  Urinalysis    Component Value Date/Time   APPEARANCEUR Clear 02/22/2023 1120   GLUCOSEU Negative 02/22/2023 1120   BILIRUBINUR Negative 02/22/2023 1120   PROTEINUR Negative 02/22/2023 1120   NITRITE Negative 02/22/2023 1120   LEUKOCYTESUR Negative 02/22/2023 1120    Lab Results  Component Value Date   LABMICR Comment 02/22/2023   WBCUA 0-5 10/19/2022   LABEPIT 0-10 10/19/2022   BACTERIA None seen 10/19/2022    Pertinent Imaging: N/a  Assessment & Plan:    Bph with - Check PSA - see in 1 year   ED - stable   No follow-ups on file.  Donnice Brooks, MD  [facility]  [address], [place] [address] [phone]

## 2024-05-09 ENCOUNTER — Ambulatory Visit: Payer: Self-pay

## 2024-05-09 LAB — PSA: Prostate Specific Ag, Serum: 2.4 ng/mL (ref 0.0–4.0)

## 2024-07-24 NOTE — Progress Notes (Deleted)
 "  New Patient Pulmonology Office Visit   Subjective:  Patient ID: Paul Burch, male    DOB: [birth year]  MRN: [id number]  Referred by: Alston Silvio BROCKS, FNP  CC: No chief complaint on file.   HPI Paul Burch is a 79 y.o. male with BPH and HTN who presents for initial evaluation of sleep disordered breathing.   The Epworth Sleepiness score is ***/24.   {STOPBANG:33649}   {PULM QUESTIONNAIRES (Optional):33196}  ROS  Allergies: Itraconazole Current Medications[1] Past Medical History:  Diagnosis Date   BPH (benign prostatic hyperplasia)    Nasal fracture 2011   Seasonal allergies    No past surgical history on file. No family history on file. Social History   Socioeconomic History   Marital status: Married    Spouse name: Not on file   Number of children: Not on file   Years of education: Not on file   Highest education level: Not on file  Occupational History   Occupation: retired  Tobacco Use   Smoking status: Never   Smokeless tobacco: Never  Substance and Sexual Activity   Alcohol use: Not Currently   Drug use: Not Currently   Sexual activity: Not on file  Other Topics Concern   Not on file  Social History Narrative   Not on file   Social Drivers of Health   Tobacco Use: Low Risk (05/17/2024)   Received from [facility]   Patient History    Smoking Tobacco Use: Never    Smokeless Tobacco Use: Never    Passive Exposure: Not on file  Financial Resource Strain: Not on file  Food Insecurity: Not on file  Transportation Needs: Not on file  Physical Activity: Not on file  Stress: Not on file  Social Connections: Not on file  Intimate Partner Violence: Not on file  Depression (EYV7-0): Not on file  Alcohol Screen: Not on file  Housing: Not on file  Utilities: Not on file  Health Literacy: Not on file       Objective:  There were no vitals taken for this visit. {Pulm Vitals (Optional):32837}  Physical Exam  Diagnostic Review:  {Labs  (Optional):32838}  Sleep study in 2022, no significant obstructive events.    Assessment & Plan:   Assessment & Plan   No orders of the defined types were placed in this encounter.     No follow-ups on file.   Lacara Dunsworth, MD    [1]  Current Outpatient Medications:    aspirin 81 MG EC tablet, Take by mouth. (Patient not taking: Reported on 05/08/2024), Disp: , Rfl:    benztropine (COGENTIN) 1 MG tablet, Take by mouth., Disp: , Rfl:    celecoxib (CELEBREX) 200 MG capsule, Take 200 mg by mouth daily., Disp: , Rfl:    finasteride  (PROSCAR ) 5 MG tablet, Take 1 tablet (5 mg total) by mouth daily., Disp: 90 tablet, Rfl: 3   HYDROcodone-acetaminophen (NORCO/VICODIN) 5-325 MG tablet, Take by mouth., Disp: , Rfl:    itraconazole (SPORANOX) 100 MG capsule, , Disp: , Rfl:    losartan (COZAAR) 50 MG tablet, 1 tablet, Disp: , Rfl:    metformin (FORTAMET) 500 MG (OSM) 24 hr tablet, Take 500 mg by mouth daily with breakfast., Disp: , Rfl:    naproxen (NAPROSYN) 500 MG tablet, Take by mouth., Disp: , Rfl:    omeprazole (PRILOSEC) 40 MG capsule, Take 40 mg by mouth every morning., Disp: , Rfl:    sildenafil  (REVATIO ) 20 MG tablet,  Take 3-5 tablets by mouth 30-60 minutes before intercourse, Disp: 50 tablet, Rfl: 11   tadalafil  (CIALIS ) 20 MG tablet, Take 1 tablet (20 mg total) by mouth daily as needed for erectile dysfunction., Disp: 10 tablet, Rfl: 0   terbinafine  (LAMISIL ) 250 MG tablet, Take 1 tablet by mouth daily., Disp: , Rfl:   "

## 2024-07-25 ENCOUNTER — Ambulatory Visit: Payer: Medicare (Managed Care) | Admitting: Pulmonary Disease

## 2025-05-21 ENCOUNTER — Ambulatory Visit: Admitting: Urology
